# Patient Record
Sex: Female | Born: 1985 | Race: White | Hispanic: No | Marital: Married | Smoking: Never smoker
Health system: Southern US, Community
[De-identification: ages and names within clinical notes are randomized; demographics above are authoritative.]

## PROBLEM LIST (undated history)

## (undated) ENCOUNTER — Inpatient Hospital Stay (HOSPITAL_COMMUNITY): Payer: Self-pay

## (undated) DIAGNOSIS — K219 Gastro-esophageal reflux disease without esophagitis: Secondary | ICD-10-CM

## (undated) DIAGNOSIS — F32A Depression, unspecified: Secondary | ICD-10-CM

## (undated) DIAGNOSIS — R87629 Unspecified abnormal cytological findings in specimens from vagina: Secondary | ICD-10-CM

## (undated) DIAGNOSIS — G5600 Carpal tunnel syndrome, unspecified upper limb: Secondary | ICD-10-CM

## (undated) DIAGNOSIS — F419 Anxiety disorder, unspecified: Secondary | ICD-10-CM

## (undated) DIAGNOSIS — Z8619 Personal history of other infectious and parasitic diseases: Secondary | ICD-10-CM

## (undated) DIAGNOSIS — T4145XA Adverse effect of unspecified anesthetic, initial encounter: Secondary | ICD-10-CM

## (undated) DIAGNOSIS — T8859XA Other complications of anesthesia, initial encounter: Secondary | ICD-10-CM

## (undated) DIAGNOSIS — F329 Major depressive disorder, single episode, unspecified: Secondary | ICD-10-CM

## (undated) HISTORY — PX: ADENOIDECTOMY AND MYRINGOTOMY WITH TUBE PLACEMENT: SHX5714

## (undated) HISTORY — DX: Major depressive disorder, single episode, unspecified: F32.9

## (undated) HISTORY — DX: Depression, unspecified: F32.A

## (undated) HISTORY — DX: Carpal tunnel syndrome, unspecified upper limb: G56.00

## (undated) HISTORY — PX: TONSILLECTOMY: SUR1361

## (undated) HISTORY — PX: WISDOM TOOTH EXTRACTION: SHX21

## (undated) HISTORY — DX: Personal history of other infectious and parasitic diseases: Z86.19

---

## 2010-12-17 ENCOUNTER — Emergency Department (HOSPITAL_COMMUNITY)
Admission: EM | Admit: 2010-12-17 | Discharge: 2010-12-18 | Disposition: A | Discharge status: Home / Self Care | Payer: BC Managed Care – PPO | Attending: Emergency Medicine | Admitting: Emergency Medicine

## 2010-12-17 DIAGNOSIS — N940 Mittelschmerz: Secondary | ICD-10-CM | POA: Insufficient documentation

## 2010-12-17 DIAGNOSIS — R1084 Generalized abdominal pain: Secondary | ICD-10-CM | POA: Insufficient documentation

## 2010-12-17 LAB — URINALYSIS, ROUTINE W REFLEX MICROSCOPIC
Glucose, UA: NEGATIVE mg/dL
Leukocytes, UA: NEGATIVE
Protein, ur: NEGATIVE mg/dL
Specific Gravity, Urine: 1.037 — ABNORMAL HIGH (ref 1.005–1.030)
pH: 5.5 (ref 5.0–8.0)

## 2010-12-18 ENCOUNTER — Emergency Department (HOSPITAL_COMMUNITY): Payer: BC Managed Care – PPO

## 2010-12-18 LAB — DIFFERENTIAL
Eosinophils Absolute: 0 10*3/uL (ref 0.0–0.7)
Lymphocytes Relative: 41 % (ref 12–46)
Lymphs Abs: 3.1 10*3/uL (ref 0.7–4.0)
Neutro Abs: 3.7 10*3/uL (ref 1.7–7.7)
Neutrophils Relative %: 48 % (ref 43–77)

## 2010-12-18 LAB — CBC
Hemoglobin: 11.4 g/dL — ABNORMAL LOW (ref 12.0–15.0)
MCV: 85 fL (ref 78.0–100.0)
Platelets: 204 10*3/uL (ref 150–400)
RBC: 3.86 MIL/uL — ABNORMAL LOW (ref 3.87–5.11)
WBC: 7.7 10*3/uL (ref 4.0–10.5)

## 2010-12-18 LAB — COMPREHENSIVE METABOLIC PANEL
ALT: 16 U/L (ref 0–35)
AST: 18 U/L (ref 0–37)
CO2: 27 mEq/L (ref 19–32)
Chloride: 103 mEq/L (ref 96–112)
Creatinine, Ser: 0.73 mg/dL (ref 0.50–1.10)
GFR calc non Af Amer: >60 mL/min (ref >60)
Sodium: 138 mEq/L (ref 135–145)
Total Bilirubin: 0.4 mg/dL (ref 0.3–1.2)

## 2010-12-18 IMAGING — US US ABDOMEN COMPLETE
1 series · 13 of 25 positions shown · non-contrast
Comparison: None

CLINICAL DATA: Abdominal pain.

ABDOMINAL ULTRASOUND COMPLETE

[Series 1: us abdomen complete · 0.30mm/px · 13 of 47 slices shown]
[im 1/47]
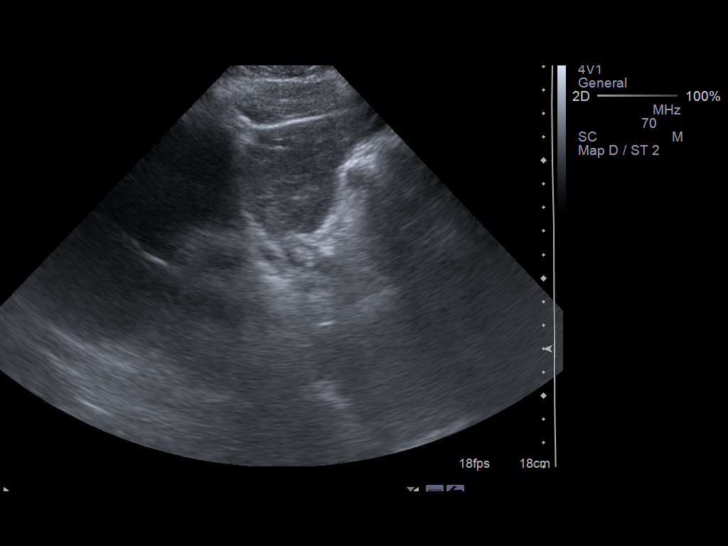
[im 4/47]
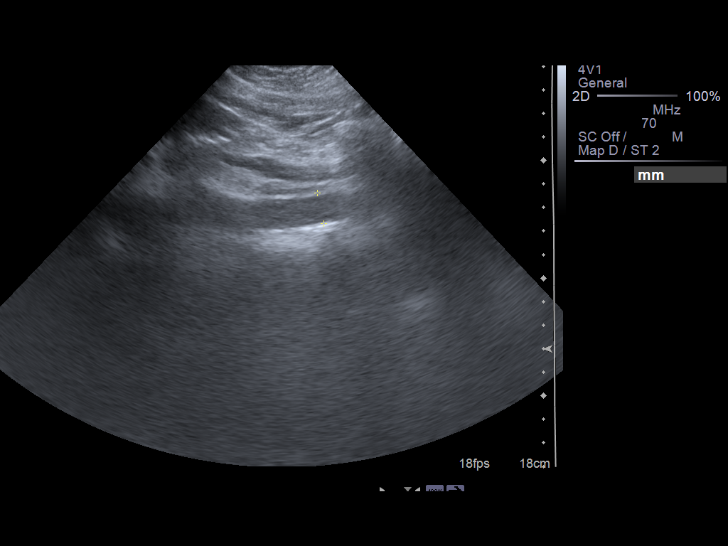
[im 8/47]
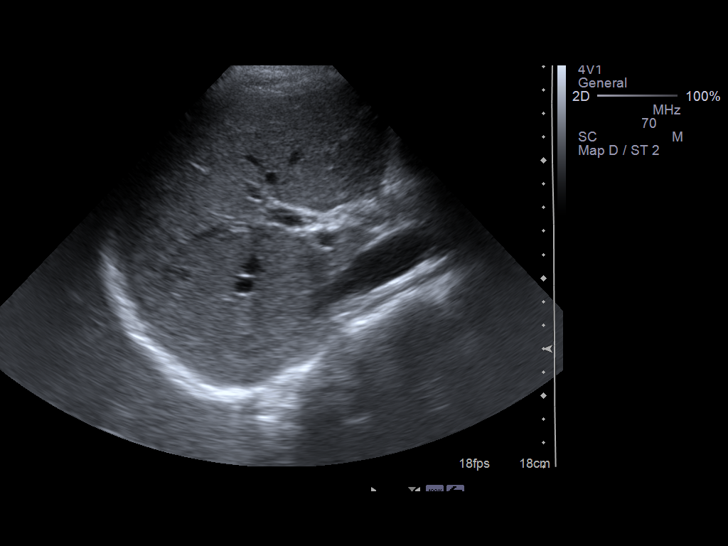
[im 12/47]
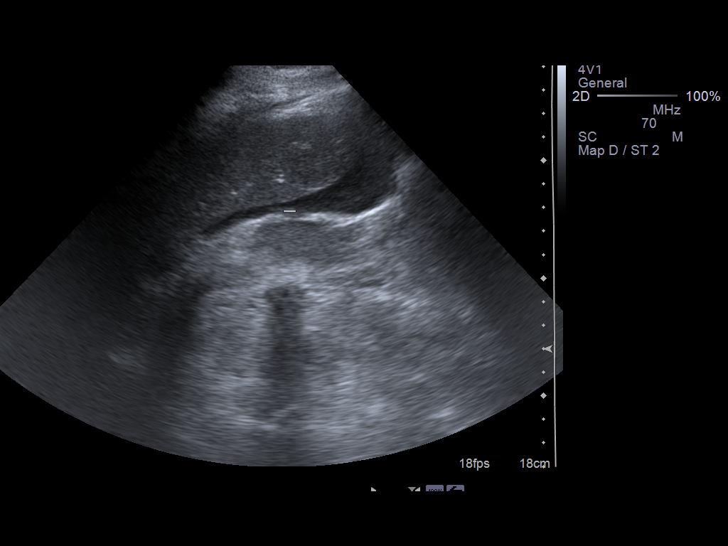
[im 16/47]
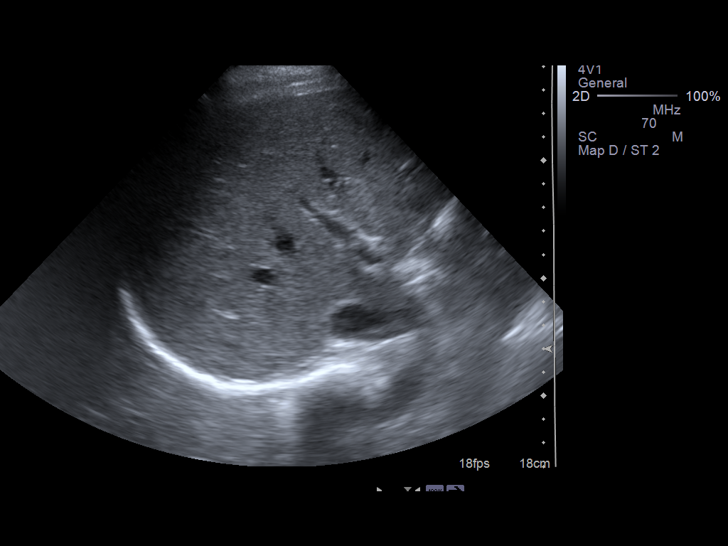
[im 20/47]
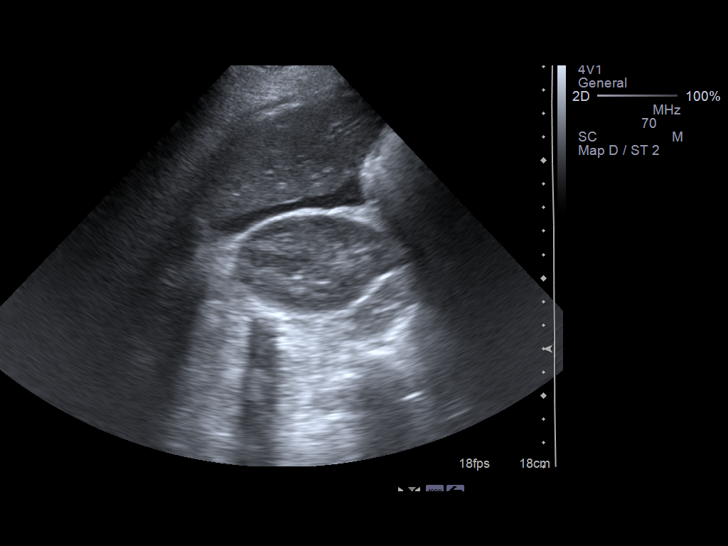
[im 24/47]
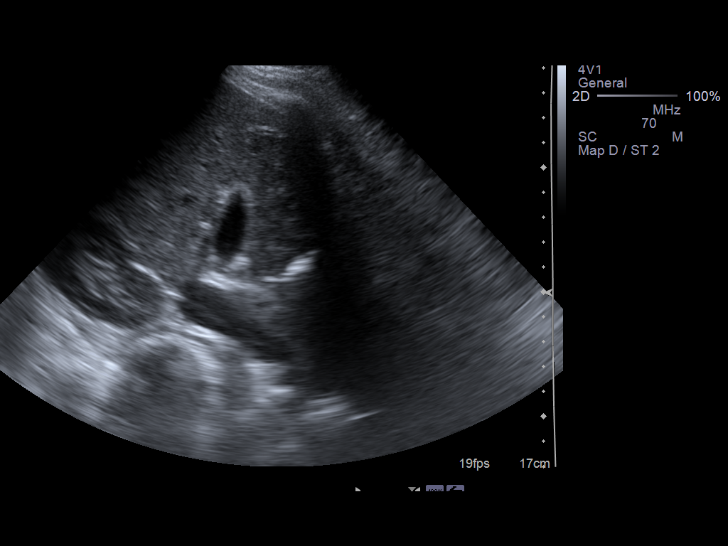
[im 27/47]
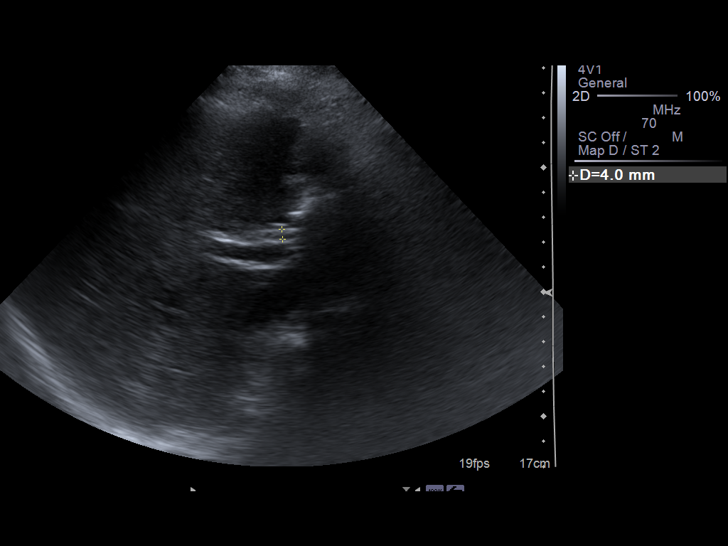
[im 31/47]
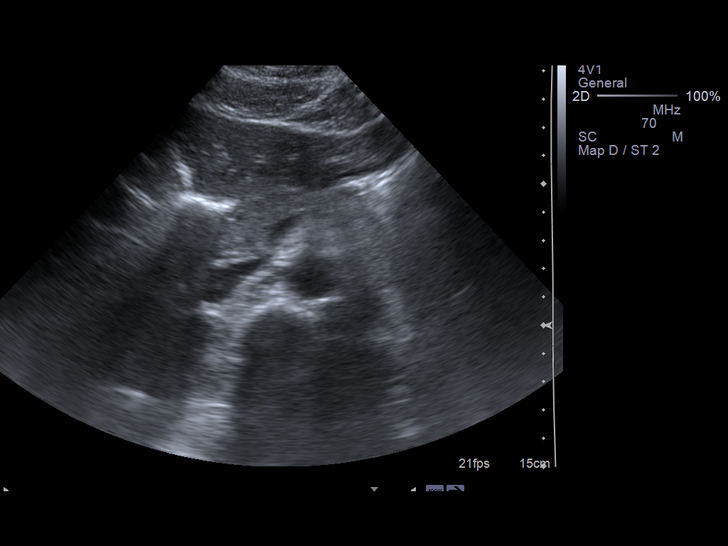
[im 35/47]
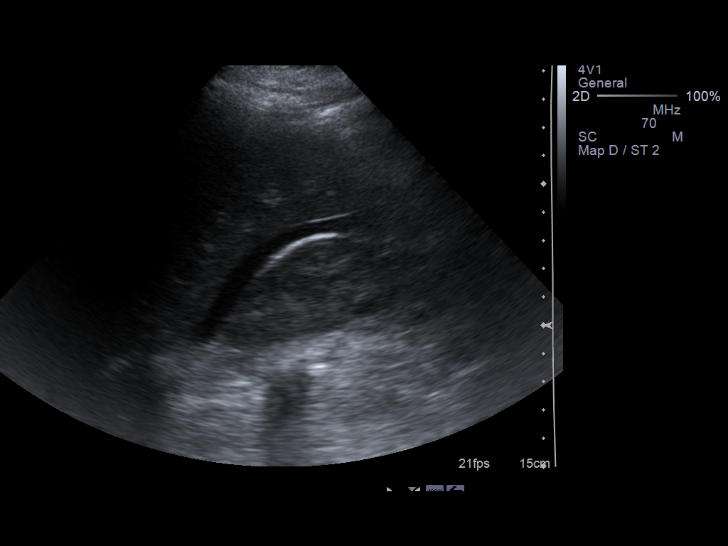
[im 39/47]
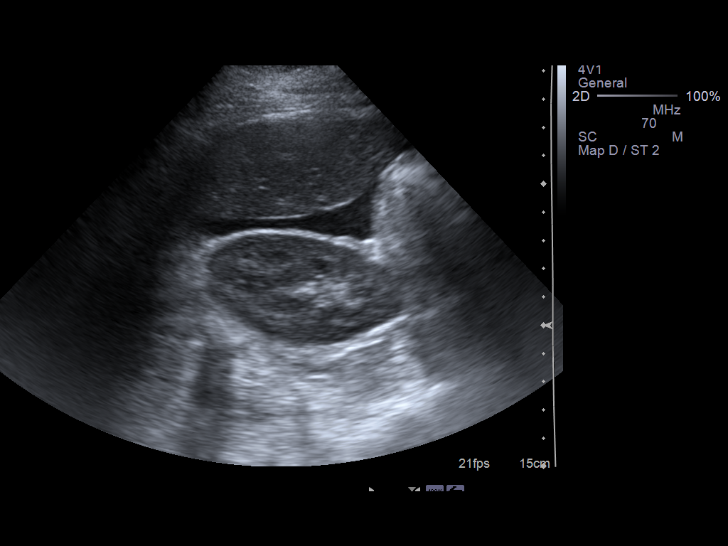
[im 43/47]
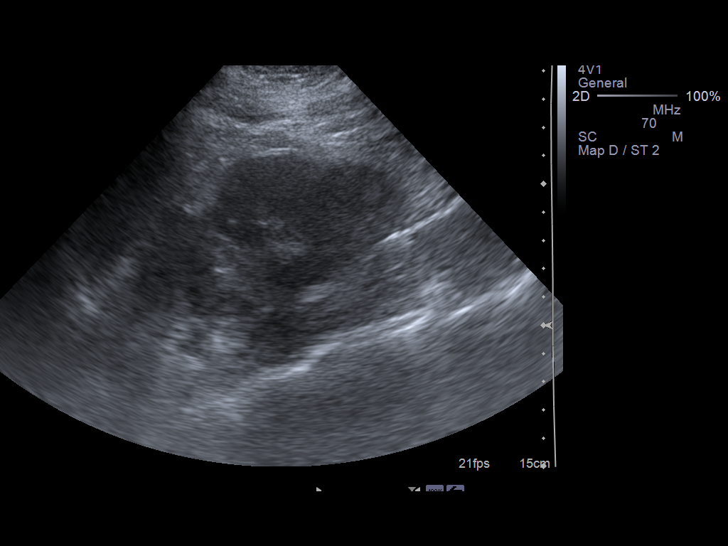
[im 47/47]
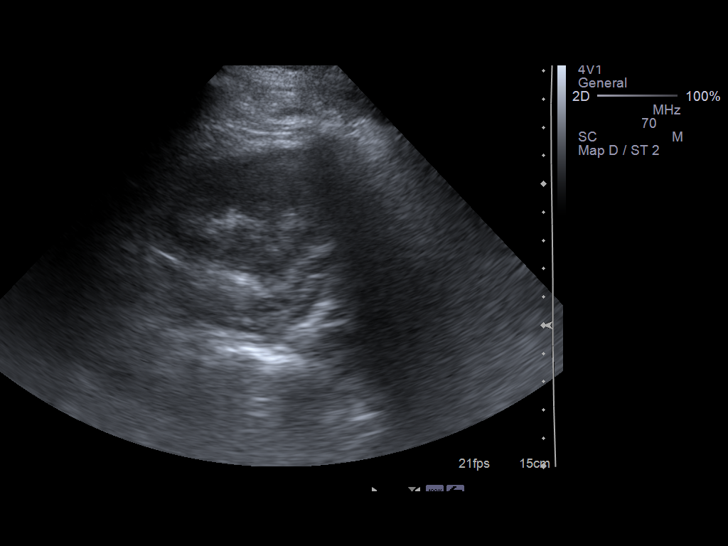

[13 of 25 positions shown; findings below may reference images not displayed]

FINDINGS: Gallbladder:  The gallbladder is normal in appearance, without
evidence for gallstones, gallbladder wall thickening or
pericholecystic fluid.  No ultrasonographic Murphy's sign is
elicited.

Common Bile Duct:  0.4 cm in diameter; within normal limits in
caliber.

Liver:  Normal parenchymal echogenicity and echotexture; no focal
lesions identified.  Limited Doppler evaluation demonstrates normal
blood flow within the liver.

IVC:  Unremarkable in appearance.

Pancreas:  Although the pancreas is difficult to visualize in its
entirety due to overlying bowel gas, no focal pancreatic
abnormality is identified.

Spleen:  8.2 cm in length; within normal limits in size and
echotexture.

Right kidney:  10.7 cm in length; normal in size, configuration and
parenchymal echogenicity.  No evidence of mass or hydronephrosis.

Left kidney:  11.0 cm in length; normal in size, configuration and
parenchymal echogenicity.  No evidence of mass or hydronephrosis.

Abdominal Aorta:  Normal in caliber; no aneurysm identified.

A small amount of ascites is noted in Morison's pouch.
IMPRESSION: 1.  Small amount of ascites noted in Morison's pouch.
2.  Otherwise unremarkable abdominal ultrasound.

## 2013-01-21 LAB — OB RESULTS CONSOLE ABO/RH: RH Type: NEGATIVE

## 2013-01-21 LAB — OB RESULTS CONSOLE GC/CHLAMYDIA
CHLAMYDIA, DNA PROBE: NEGATIVE
GC PROBE AMP, GENITAL: NEGATIVE

## 2013-01-21 LAB — OB RESULTS CONSOLE RPR: RPR: NONREACTIVE

## 2013-01-21 LAB — OB RESULTS CONSOLE HEPATITIS B SURFACE ANTIGEN: HEP B S AG: NEGATIVE

## 2013-01-21 LAB — OB RESULTS CONSOLE RUBELLA ANTIBODY, IGM: Rubella: IMMUNE

## 2013-01-21 LAB — OB RESULTS CONSOLE ANTIBODY SCREEN: ANTIBODY SCREEN: NEGATIVE

## 2013-01-21 LAB — OB RESULTS CONSOLE HIV ANTIBODY (ROUTINE TESTING): HIV: NONREACTIVE

## 2013-04-08 NOTE — L&D Delivery Note (Signed)
Delivery Note  SVD viable female Apgars 8,9 over 2nd degree ML episiotomy.  Nuchal x 1 reduced.  Placenta delivered spontaneously intact with 3VC. Repair with 2-0 Chromic with good support and hemostasis noted and R/V exam confirms.  PH art was 7.19.  [name] cord blood was not done.  Mother and baby were doing well.  EBL 350cc  Jaclyn Camp, MD

## 2013-07-22 LAB — OB RESULTS CONSOLE GBS: STREP GROUP B AG: POSITIVE

## 2013-08-14 ENCOUNTER — Inpatient Hospital Stay (HOSPITAL_COMMUNITY)
Admission: AD | Admit: 2013-08-14 | Discharge: 2013-08-14 | Disposition: A | Discharge status: Home / Self Care | Payer: BC Managed Care – PPO | Source: Ambulatory Visit | Attending: Obstetrics and Gynecology | Admitting: Obstetrics and Gynecology

## 2013-08-14 ENCOUNTER — Encounter (HOSPITAL_COMMUNITY): Payer: Self-pay | Admitting: *Deleted

## 2013-08-14 DIAGNOSIS — K219 Gastro-esophageal reflux disease without esophagitis: Secondary | ICD-10-CM | POA: Insufficient documentation

## 2013-08-14 DIAGNOSIS — O368190 Decreased fetal movements, unspecified trimester, not applicable or unspecified: Secondary | ICD-10-CM | POA: Insufficient documentation

## 2013-08-14 DIAGNOSIS — O3660X0 Maternal care for excessive fetal growth, unspecified trimester, not applicable or unspecified: Secondary | ICD-10-CM | POA: Insufficient documentation

## 2013-08-14 HISTORY — DX: Anxiety disorder, unspecified: F41.9

## 2013-08-14 HISTORY — DX: Unspecified abnormal cytological findings in specimens from vagina: R87.629

## 2013-08-14 HISTORY — DX: Gastro-esophageal reflux disease without esophagitis: K21.9

## 2013-08-14 NOTE — Discharge Instructions (Signed)
Third Trimester of Pregnancy °The third trimester is from week 29 through week 42, months 7 through 9. The third trimester is a time when the fetus is growing rapidly. At the end of the ninth month, the fetus is about 20 inches in length and weighs 6 10 pounds.  °BODY CHANGES °Your body goes through many changes during pregnancy. The changes vary from woman to woman.  °· Your weight will continue to increase. You can expect to gain 25 35 pounds (11 16 kg) by the end of the pregnancy. °· You may begin to get stretch marks on your hips, abdomen, and breasts. °· You may urinate more often because the fetus is moving lower into your pelvis and pressing on your bladder. °· You may develop or continue to have heartburn as a result of your pregnancy. °· You may develop constipation because certain hormones are causing the muscles that push waste through your intestines to slow down. °· You may develop hemorrhoids or swollen, bulging veins (varicose veins). °· You may have pelvic pain because of the weight gain and pregnancy hormones relaxing your joints between the bones in your pelvis. Back aches may result from over exertion of the muscles supporting your posture. °· Your breasts will continue to grow and be tender. A yellow discharge may leak from your breasts called colostrum. °· Your belly button may stick out. °· You may feel short of breath because of your expanding uterus. °· You may notice the fetus "dropping," or moving lower in your abdomen. °· You may have a bloody mucus discharge. This usually occurs a few days to a week before labor begins. °· Your cervix becomes thin and soft (effaced) near your due date. °WHAT TO EXPECT AT YOUR PRENATAL EXAMS  °You will have prenatal exams every 2 weeks until week 36. Then, you will have weekly prenatal exams. During a routine prenatal visit: °· You will be weighed to make sure you and the fetus are growing normally. °· Your blood pressure is taken. °· Your abdomen will be  measured to track your baby's growth. °· The fetal heartbeat will be listened to. °· Any test results from the previous visit will be discussed. °· You may have a cervical check near your due date to see if you have effaced. °At around 36 weeks, your caregiver will check your cervix. At the same time, your caregiver will also perform a test on the secretions of the vaginal tissue. This test is to determine if a type of bacteria, Group B streptococcus, is present. Your caregiver will explain this further. °Your caregiver may ask you: °· What your birth plan is. °· How you are feeling. °· If you are feeling the baby move. °· If you have had any abnormal symptoms, such as leaking fluid, bleeding, severe headaches, or abdominal cramping. °· If you have any questions. °Other tests or screenings that may be performed during your third trimester include: °· Blood tests that check for low iron levels (anemia). °· Fetal testing to check the health, activity level, and growth of the fetus. Testing is done if you have certain medical conditions or if there are problems during the pregnancy. °FALSE LABOR °You may feel small, irregular contractions that eventually go away. These are called Braxton Hicks contractions, or false labor. Contractions may last for hours, days, or even weeks before true labor sets in. If contractions come at regular intervals, intensify, or become painful, it is best to be seen by your caregiver.  °  SIGNS OF LABOR  °· Menstrual-like cramps. °· Contractions that are 5 minutes apart or less. °· Contractions that start on the top of the uterus and spread down to the lower abdomen and back. °· A sense of increased pelvic pressure or back pain. °· A watery or bloody mucus discharge that comes from the vagina. °If you have any of these signs before the 37th week of pregnancy, call your caregiver right away. You need to go to the hospital to get checked immediately. °HOME CARE INSTRUCTIONS  °· Avoid all  smoking, herbs, alcohol, and unprescribed drugs. These chemicals affect the formation and growth of the baby. °· Follow your caregiver's instructions regarding medicine use. There are medicines that are either safe or unsafe to take during pregnancy. °· Exercise only as directed by your caregiver. Experiencing uterine cramps is a good sign to stop exercising. °· Continue to eat regular, healthy meals. °· Wear a good support bra for breast tenderness. °· Do not use hot tubs, steam rooms, or saunas. °· Wear your seat belt at all times when driving. °· Avoid raw meat, uncooked cheese, cat litter boxes, and soil used by cats. These carry germs that can cause birth defects in the baby. °· Take your prenatal vitamins. °· Try taking a stool softener (if your caregiver approves) if you develop constipation. Eat more high-fiber foods, such as fresh vegetables or fruit and whole grains. Drink plenty of fluids to keep your urine clear or pale yellow. °· Take warm sitz baths to soothe any pain or discomfort caused by hemorrhoids. Use hemorrhoid cream if your caregiver approves. °· If you develop varicose veins, wear support hose. Elevate your feet for 15 minutes, 3 4 times a day. Limit salt in your diet. °· Avoid heavy lifting, wear low heal shoes, and practice good posture. °· Rest a lot with your legs elevated if you have leg cramps or low back pain. °· Visit your dentist if you have not gone during your pregnancy. Use a soft toothbrush to brush your teeth and be gentle when you floss. °· A sexual relationship may be continued unless your caregiver directs you otherwise. °· Do not travel far distances unless it is absolutely necessary and only with the approval of your caregiver. °· Take prenatal classes to understand, practice, and ask questions about the labor and delivery. °· Make a trial run to the hospital. °· Pack your hospital bag. °· Prepare the baby's nursery. °· Continue to go to all your prenatal visits as directed  by your caregiver. °SEEK MEDICAL CARE IF: °· You are unsure if you are in labor or if your water has broken. °· You have dizziness. °· You have mild pelvic cramps, pelvic pressure, or nagging pain in your abdominal area. °· You have persistent nausea, vomiting, or diarrhea. °· You have a bad smelling vaginal discharge. °· You have pain with urination. °SEEK IMMEDIATE MEDICAL CARE IF:  °· You have a fever. °· You are leaking fluid from your vagina. °· You have spotting or bleeding from your vagina. °· You have severe abdominal cramping or pain. °· You have rapid weight loss or gain. °· You have shortness of breath with chest pain. °· You notice sudden or extreme swelling of your face, hands, ankles, feet, or legs. °· You have not felt your baby move in over an hour. °· You have severe headaches that do not go away with medicine. °· You have vision changes. °Document Released: 03/19/2001 Document Revised: 11/25/2012 Document Reviewed:   05/26/2012 Exit[employer] Patient Information 2014 [place] Fetal Movement Counts Patient Name: _____Tara Starnes_____________________________________________ Patient Due Date: ___05/20_________________ Performing a fetal movement count is highly recommended in high-risk pregnancies, but it is good for every pregnant woman to do. Your caregiver may ask you to start counting fetal movements at 28 weeks of the pregnancy. Fetal movements often increase:  After eating a full meal.  After physical activity.  After eating or drinking something sweet or cold.  At rest. Pay attention to when you feel the baby is most active. This will help you notice a pattern of your baby's sleep and wake cycles and what factors contribute to an increase in fetal movement. It is important to perform a fetal movement count at the same time each day when your baby is normally most active.  HOW TO COUNT FETAL MOVEMENTS 1. Find a quiet and comfortable area to sit or lie down on your left side.  Lying on your left side provides the best blood and oxygen circulation to your baby. 2. Write down the day and time on a sheet of paper or in a journal. 3. Start counting kicks, flutters, swishes, rolls, or jabs in a 2 hour period. You should feel at least 10 movements within 2 hours. 4. If you do not feel 10 movements in 2 hours, wait 2 3 hours and count again. Look for a change in the pattern or not enough counts in 2 hours. SEEK MEDICAL CARE IF:  You feel less than 10 counts in 2 hours, tried twice.  There is no movement in over an hour.  The pattern is changing or taking longer each day to reach 10 counts in 2 hours.  You feel the baby is not moving as he or she usually does. Date: ____________ Movements: ____________ Start time: ____________ Doreatha MartinFinish time: ____________  Date: ____________ Movements: ____________ Start time: ____________ Doreatha MartinFinish time: ____________ Date: ____________ Movements: ____________ Start time: ____________ Doreatha MartinFinish time: ____________ Date: ____________ Movements: ____________ Start time: ____________ Doreatha MartinFinish time: ____________ Date: ____________ Movements: ____________ Start time: ____________ Doreatha MartinFinish time: ____________ Date: ____________ Movements: ____________ Start time: ____________ Doreatha MartinFinish time: ____________ Date: ____________ Movements: ____________ Start time: ____________ Doreatha MartinFinish time: ____________ Date: ____________ Movements: ____________ Start time: ____________ Doreatha MartinFinish time: ____________  Date: ____________ Movements: ____________ Start time: ____________ Doreatha MartinFinish time: ____________ Date: ____________ Movements: ____________ Start time: ____________ Doreatha MartinFinish time: ____________ Date: ____________ Movements: ____________ Start time: ____________ Doreatha MartinFinish time: ____________ Date: ____________ Movements: ____________ Start time: ____________ Doreatha MartinFinish time: ____________ Date: ____________ Movements: ____________ Start time: ____________ Doreatha MartinFinish time: ____________ Date:  ____________ Movements: ____________ Start time: ____________ Doreatha MartinFinish time: ____________ Date: ____________ Movements: ____________ Start time: ____________ Doreatha MartinFinish time: ____________  Date: ____________ Movements: ____________ Start time: ____________ Doreatha MartinFinish time: ____________ Date: ____________ Movements: ____________ Start time: ____________ Doreatha MartinFinish time: ____________ Date: ____________ Movements: ____________ Start time: ____________ Doreatha MartinFinish time: ____________ Date: ____________ Movements: ____________ Start time: ____________ Doreatha MartinFinish time: ____________ Date: ____________ Movements: ____________ Start time: ____________ Doreatha MartinFinish time: ____________ Date: ____________ Movements: ____________ Start time: ____________ Doreatha MartinFinish time: ____________ Date: ____________ Movements: ____________ Start time: ____________ Doreatha MartinFinish time: ____________  Date: ____________ Movements: ____________ Start time: ____________ Doreatha MartinFinish time: ____________ Date: ____________ Movements: ____________ Start time: ____________ Doreatha MartinFinish time: ____________ Date: ____________ Movements: ____________ Start time: ____________ Doreatha MartinFinish time: ____________ Date: ____________ Movements: ____________ Start time: ____________ Doreatha MartinFinish time: ____________ Date: ____________ Movements: ____________ Start time: ____________ Doreatha MartinFinish time: ____________

## 2013-08-14 NOTE — MAU Note (Signed)
Baby is moving, just not as much.  Did kick counts, got 10 movement in 50 min; was told this was really good, but patient states, usually get that quicker.  Denies any pain, bleeding or leaking. No problems with preg.

## 2013-08-14 NOTE — MAU Provider Note (Signed)
  History     CSN: [id number]  Arrival date and time: 08/14/13 1214   First Provider Initiated Contact with Patient 08/14/13 1323      Chief Complaint  Patient presents with  . Decreased Fetal Movement   HPI Pt is 38w3 days pregnant and presents with decrease fetal movement.  Pt states kicks are not as strong, more rolling Movement.  Pt denis problems with this pregnancy.  Pt denies vaginal bleeding,  Spotting, leakage of fluid.  Baby is Measuring larger than dates- no gestational diabetes.  Pt is primary Dr. Henderson Cloudomblin. RN note: Baby is moving, just not as much. Did kick counts, got 10 movement in 50 min; was told this was really good, but patient states, usually get that quicker. Denies any pain, bleeding or leaking. No problems with preg     Past Medical History  Diagnosis Date  . GERD (gastroesophageal reflux disease)   . Anxiety   . Vaginal Pap smear, abnormal     bx was normal    Past Surgical History  Procedure Laterality Date  . Tonsillectomy    . Adenoidectomy and myringotomy with tube placement      Family History  Problem Relation Age of Onset  . Cancer Maternal Grandmother     lung, smoker  . Diabetes Paternal Grandfather   . Hearing loss Neg Hx     History  Substance Use Topics  . Smoking status: Never Smoker   . Smokeless tobacco: Never Used  . Alcohol Use: Yes     Comment: occ, none with preg    Allergies: Allergies not on file  No prescriptions prior to admission    ROS Physical Exam   Blood pressure 122/74, pulse 97, temperature 98.4 F (36.9 C), temperature source Oral, resp. rate 18.  Physical Exam  MAU Course  Procedures Reactive FHR  D/c home by Dr. Renaldo FiddlerAdkins via nurse  Assessment and Plan    Jean RosenthalSusan P Brantlee Hinde 08/14/2013, 1:23 PM

## 2013-08-23 ENCOUNTER — Encounter (HOSPITAL_COMMUNITY): Payer: Self-pay | Admitting: *Deleted

## 2013-08-23 ENCOUNTER — Telehealth (HOSPITAL_COMMUNITY): Payer: Self-pay | Admitting: *Deleted

## 2013-08-23 NOTE — Telephone Encounter (Signed)
Preadmission screen  

## 2013-08-26 ENCOUNTER — Inpatient Hospital Stay (HOSPITAL_COMMUNITY): Admission: RE | Admit: 2013-08-26 | Payer: BC Managed Care – PPO | Source: Ambulatory Visit

## 2013-09-01 ENCOUNTER — Inpatient Hospital Stay (HOSPITAL_COMMUNITY): Admission: RE | Admit: 2013-09-01 | Payer: BC Managed Care – PPO | Source: Ambulatory Visit

## 2013-09-02 ENCOUNTER — Inpatient Hospital Stay (HOSPITAL_COMMUNITY): Payer: BC Managed Care – PPO | Admitting: Anesthesiology

## 2013-09-02 ENCOUNTER — Inpatient Hospital Stay (HOSPITAL_COMMUNITY)
Admission: RE | Admit: 2013-09-02 | Discharge: 2013-09-04 | DRG: 775 | Disposition: A | Discharge status: Home / Self Care | Payer: BC Managed Care – PPO | Source: Ambulatory Visit | Attending: Obstetrics and Gynecology | Admitting: Obstetrics and Gynecology

## 2013-09-02 ENCOUNTER — Encounter (HOSPITAL_COMMUNITY): Payer: BC Managed Care – PPO | Admitting: Anesthesiology

## 2013-09-02 ENCOUNTER — Encounter (HOSPITAL_COMMUNITY): Payer: Self-pay

## 2013-09-02 DIAGNOSIS — K219 Gastro-esophageal reflux disease without esophagitis: Secondary | ICD-10-CM | POA: Diagnosis present

## 2013-09-02 DIAGNOSIS — O99214 Obesity complicating childbirth: Secondary | ICD-10-CM

## 2013-09-02 DIAGNOSIS — E669 Obesity, unspecified: Secondary | ICD-10-CM | POA: Diagnosis present

## 2013-09-02 DIAGNOSIS — Z6841 Body Mass Index (BMI) 40.0 and over, adult: Secondary | ICD-10-CM

## 2013-09-02 DIAGNOSIS — Z833 Family history of diabetes mellitus: Secondary | ICD-10-CM

## 2013-09-02 DIAGNOSIS — O480 Post-term pregnancy: Principal | ICD-10-CM | POA: Diagnosis present

## 2013-09-02 LAB — CBC
HEMATOCRIT: 37.6 % (ref 36.0–46.0)
Hemoglobin: 12.3 g/dL (ref 12.0–15.0)
MCH: 28.3 pg (ref 26.0–34.0)
MCHC: 32.7 g/dL (ref 30.0–36.0)
MCV: 86.6 fL (ref 78.0–100.0)
PLATELETS: 198 10*3/uL (ref 150–400)
RBC: 4.34 MIL/uL (ref 3.87–5.11)
RDW: 13.9 % (ref 11.5–15.5)
WBC: 14 10*3/uL — ABNORMAL HIGH (ref 4.0–10.5)

## 2013-09-02 LAB — RPR

## 2013-09-02 MED ORDER — LACTATED RINGERS IV SOLN
500.0000 mL | INTRAVENOUS | Status: DC | PRN
  Administered 2013-09-02: 1000 mL via INTRAVENOUS

## 2013-09-02 MED ORDER — OXYCODONE-ACETAMINOPHEN 5-325 MG PO TABS: 1.0000 | ORAL_TABLET | ORAL | Status: DC | PRN

## 2013-09-02 MED ORDER — IBUPROFEN 600 MG PO TABS
600.0000 mg | ORAL_TABLET | Freq: Four times a day (QID) | ORAL | Status: DC | PRN
  Administered 2013-09-03: 600 mg via ORAL
  Filled 2013-09-02: qty 1

## 2013-09-02 MED ORDER — FLEET ENEMA 7-19 GM/118ML RE ENEM: 1.0000 | ENEMA | RECTAL | Status: DC | PRN

## 2013-09-02 MED ORDER — PHENYLEPHRINE 40 MCG/ML (10ML) SYRINGE FOR IV PUSH (FOR BLOOD PRESSURE SUPPORT)
80.0000 ug | PREFILLED_SYRINGE | INTRAVENOUS | Status: DC | PRN
  Administered 2013-09-02 (×2): 80 ug via INTRAVENOUS
  Filled 2013-09-02: qty 2
  Filled 2013-09-02: qty 10

## 2013-09-02 MED ORDER — PENICILLIN G POTASSIUM 5000000 UNITS IJ SOLR
2.5000 10*6.[IU] | INTRAVENOUS | Status: DC
  Administered 2013-09-02 – 2013-09-03 (×4): 2.5 10*6.[IU] via INTRAVENOUS
  Filled 2013-09-02 (×8): qty 2.5

## 2013-09-02 MED ORDER — LIDOCAINE HCL (PF) 1 % IJ SOLN
30.0000 mL | INTRAMUSCULAR | Status: DC | PRN
  Filled 2013-09-02: qty 30

## 2013-09-02 MED ORDER — ACETAMINOPHEN 325 MG PO TABS: 650.0000 mg | ORAL_TABLET | ORAL | Status: DC | PRN

## 2013-09-02 MED ORDER — EPHEDRINE 5 MG/ML INJ
10.0000 mg | INTRAVENOUS | Status: AC | PRN
  Administered 2013-09-02: 10 mg via INTRAVENOUS
  Administered 2013-09-02: 30 mg via INTRAVENOUS
  Filled 2013-09-02: qty 4

## 2013-09-02 MED ORDER — LACTATED RINGERS IV SOLN
500.0000 mL | Freq: Once | INTRAVENOUS | Status: AC
  Administered 2013-09-02: 1000 mL via INTRAVENOUS

## 2013-09-02 MED ORDER — TERBUTALINE SULFATE 1 MG/ML IJ SOLN: 0.2500 mg | Freq: Once | INTRAMUSCULAR | Status: AC | PRN

## 2013-09-02 MED ORDER — PENICILLIN G POTASSIUM 5000000 UNITS IJ SOLR
5.0000 10*6.[IU] | Freq: Once | INTRAVENOUS | Status: AC
  Administered 2013-09-02: 5 10*6.[IU] via INTRAVENOUS
  Filled 2013-09-02: qty 5

## 2013-09-02 MED ORDER — EPHEDRINE 5 MG/ML INJ
10.0000 mg | INTRAVENOUS | Status: DC | PRN
  Filled 2013-09-02: qty 4
  Filled 2013-09-02: qty 2

## 2013-09-02 MED ORDER — FENTANYL 2.5 MCG/ML BUPIVACAINE 1/10 % EPIDURAL INFUSION (WH - ANES)
INTRAMUSCULAR | Status: DC | PRN
  Administered 2013-09-02: 14 mL/h via EPIDURAL

## 2013-09-02 MED ORDER — PHENYLEPHRINE 40 MCG/ML (10ML) SYRINGE FOR IV PUSH (FOR BLOOD PRESSURE SUPPORT)
PREFILLED_SYRINGE | INTRAVENOUS | Status: AC
  Administered 2013-09-02: 40 ug
  Filled 2013-09-02: qty 10

## 2013-09-02 MED ORDER — PHENYLEPHRINE 40 MCG/ML (10ML) SYRINGE FOR IV PUSH (FOR BLOOD PRESSURE SUPPORT)
80.0000 ug | PREFILLED_SYRINGE | INTRAVENOUS | Status: DC | PRN
  Filled 2013-09-02: qty 2
  Filled 2013-09-02 (×2): qty 10

## 2013-09-02 MED ORDER — DIPHENHYDRAMINE HCL 50 MG/ML IJ SOLN: 12.5000 mg | INTRAMUSCULAR | Status: DC | PRN

## 2013-09-02 MED ORDER — LIDOCAINE HCL (PF) 1 % IJ SOLN
INTRAMUSCULAR | Status: DC | PRN
  Administered 2013-09-02: 8 mL
  Administered 2013-09-02: 5 mL
  Administered 2013-09-02: 9 mL

## 2013-09-02 MED ORDER — FENTANYL 2.5 MCG/ML BUPIVACAINE 1/10 % EPIDURAL INFUSION (WH - ANES)
14.0000 mL/h | INTRAMUSCULAR | Status: DC | PRN
  Filled 2013-09-02: qty 125

## 2013-09-02 MED ORDER — EPHEDRINE 5 MG/ML INJ
INTRAVENOUS | Status: AC
  Filled 2013-09-02: qty 4

## 2013-09-02 MED ORDER — OXYTOCIN 40 UNITS IN LACTATED RINGERS INFUSION - SIMPLE MED
62.5000 mL/h | INTRAVENOUS | Status: DC
  Administered 2013-09-03: 62.5 mL/h via INTRAVENOUS

## 2013-09-02 MED ORDER — FENTANYL 2.5 MCG/ML BUPIVACAINE 1/10 % EPIDURAL INFUSION (WH - ANES)
INTRAMUSCULAR | Status: AC
  Filled 2013-09-02: qty 125

## 2013-09-02 MED ORDER — OXYTOCIN 40 UNITS IN LACTATED RINGERS INFUSION - SIMPLE MED
1.0000 m[IU]/min | INTRAVENOUS | Status: DC
  Administered 2013-09-02: 2 m[IU]/min via INTRAVENOUS
  Administered 2013-09-02: 4 m[IU]/min via INTRAVENOUS
  Filled 2013-09-02: qty 1000

## 2013-09-02 MED ORDER — OXYTOCIN BOLUS FROM INFUSION
500.0000 mL | INTRAVENOUS | Status: DC
  Administered 2013-09-03: 500 mL via INTRAVENOUS

## 2013-09-02 MED ORDER — CITRIC ACID-SODIUM CITRATE 334-500 MG/5ML PO SOLN
30.0000 mL | ORAL | Status: DC | PRN
Start: 2013-09-02 — End: 2013-09-03

## 2013-09-02 MED ORDER — ONDANSETRON HCL 4 MG/2ML IJ SOLN
4.0000 mg | Freq: Four times a day (QID) | INTRAMUSCULAR | Status: DC | PRN
  Filled 2013-09-02: qty 2

## 2013-09-02 MED ORDER — LACTATED RINGERS IV SOLN
INTRAVENOUS | Status: DC
  Administered 2013-09-02 (×2): 1000 mL via INTRAVENOUS
  Administered 2013-09-03: via INTRAVENOUS

## 2013-09-02 NOTE — Progress Notes (Signed)
contacted Anesthsia, 3 cc ephedrine IVP ordered

## 2013-09-02 NOTE — Anesthesia Preprocedure Evaluation (Signed)
Anesthesia Evaluation  Patient identified by MRN, date of birth, ID band Patient awake    Reviewed: Allergy & Precautions, H&P , NPO status , Patient's Chart, lab work & pertinent test results  Airway Mallampati: II TM Distance: >3 FB Neck ROM: full    Dental no notable dental hx.    Pulmonary neg pulmonary ROS,    Pulmonary exam normal       Cardiovascular negative cardio ROS      Neuro/Psych negative psych ROS   GI/Hepatic Neg liver ROS,   Endo/Other  Morbid obesity  Renal/GU negative Renal ROS     Musculoskeletal   Abdominal (+) + obese,   Peds  Hematology negative hematology ROS (+)   Anesthesia Other Findings   Reproductive/Obstetrics (+) Pregnancy                           Anesthesia Physical Anesthesia Plan  ASA: III  Anesthesia Plan: Epidural   Post-op Pain Management:    Induction:   Airway Management Planned:   Additional Equipment:   Intra-op Plan:   Post-operative Plan:   Informed Consent: I have reviewed the patients History and Physical, chart, labs and discussed the procedure including the risks, benefits and alternatives for the proposed anesthesia with the patient or authorized representative who has indicated his/her understanding and acceptance.     Plan Discussed with:   Anesthesia Plan Comments:         Anesthesia Quick Evaluation

## 2013-09-02 NOTE — Progress Notes (Signed)
Two more RN's in room , fluid bolus, alcohol prep pt and ammonia inhalant admin. Pt regain conc, Zofran given IV push turned to left side.

## 2013-09-02 NOTE — Progress Notes (Signed)
@   extra Rns in room Dr Arby Barrette in room, epidural off, Dr Rana Snare on way

## 2013-09-02 NOTE — Anesthesia Procedure Notes (Signed)
Epidural Patient location during procedure: OB Start time: 09/02/2013 4:07 PM End time: 09/02/2013 4:11 PM  Staffing Anesthesiologist: Leilani Able Performed by: anesthesiologist   Preanesthetic Checklist Completed: patient identified, surgical consent, pre-op evaluation, timeout performed, IV checked, risks and benefits discussed and monitors and equipment checked  Epidural Patient position: sitting Prep: site prepped and draped and DuraPrep Patient monitoring: continuous pulse ox and blood pressure Approach: midline Location: L3-L4 Injection technique: LOR air  Needle:  Needle type: Tuohy  Needle gauge: 17 G Needle length: 9 cm and 9 Needle insertion depth: 6 cm Catheter type: closed end flexible Catheter size: 19 Gauge Catheter at skin depth: 11 cm Test dose: negative and Other  Assessment Sensory level: T9 Events: blood not aspirated, injection not painful, no injection resistance, negative IV test and no paresthesia  Additional Notes Reason for block:procedure for pain

## 2013-09-02 NOTE — H&P (Signed)
Jaclyn Jones is a 28 y.o. female presenting for IOL due to postdates.  Pregnancy uncomplicated.  GBS -. History OB History   Grav Para Term Preterm Abortions TAB SAB Ect Mult Living   1              Past Medical History  Diagnosis Date  . GERD (gastroesophageal reflux disease)   . Anxiety   . Vaginal Pap smear, abnormal     bx was normal  . Carpal tunnel syndrome    Past Surgical History  Procedure Laterality Date  . Tonsillectomy    . Adenoidectomy and myringotomy with tube placement     Family History: family history includes Cancer in her maternal grandmother; Diabetes in her paternal grandfather. There is no history of Hearing loss. Social History:  reports that she has never smoked. She has never used smokeless tobacco. She reports that she drinks alcohol. She reports that she does not use illicit drugs.   Prenatal Transfer Tool  Maternal Diabetes: No Genetic Screening: Normal Maternal Ultrasounds/Referrals: Normal Fetal Ultrasounds or other Referrals:  None Maternal Substance Abuse:  No Significant Maternal Medications:  None Significant Maternal Lab Results:  None Other Comments:  None  ROS  Dilation: 3 Effacement (%): 70 Station: -2 Exam by:: Dr Rana Snare Blood pressure 133/92, pulse 119, temperature 98.8 F (37.1 C), temperature source Oral, resp. rate 18, height 5\' 4"  (1.626 m), weight 108.863 kg (240 lb). Exam Physical Exam  Prenatal labs: ABO, Rh: O/Negative/-- (10/16 0000) Antibody: Negative (10/16 0000) Rubella: Immune (10/16 0000) RPR: Nonreactive (10/16 0000)  HBsAg: Negative (10/16 0000)  HIV: Non-reactive (10/16 0000)  GBS: Positive (04/16 0000)   Assessment/Plan: IUP postdates for IOL AROM with possible Pitocin Abx for GBS Anticipate SVD   Turner Daniels 09/02/2013, 9:36 AM

## 2013-09-03 ENCOUNTER — Encounter (HOSPITAL_COMMUNITY): Payer: Self-pay

## 2013-09-03 LAB — CBC
HEMATOCRIT: 32.6 % — AB (ref 36.0–46.0)
Hemoglobin: 10.8 g/dL — ABNORMAL LOW (ref 12.0–15.0)
MCH: 28.8 pg (ref 26.0–34.0)
MCHC: 33.1 g/dL (ref 30.0–36.0)
MCV: 86.9 fL (ref 78.0–100.0)
Platelets: 215 10*3/uL (ref 150–400)
RBC: 3.75 MIL/uL — ABNORMAL LOW (ref 3.87–5.11)
RDW: 14 % (ref 11.5–15.5)
WBC: 32.7 10*3/uL — ABNORMAL HIGH (ref 4.0–10.5)

## 2013-09-03 MED ORDER — WITCH HAZEL-GLYCERIN EX PADS: 1.0000 "application " | MEDICATED_PAD | CUTANEOUS | Status: DC | PRN

## 2013-09-03 MED ORDER — IBUPROFEN 600 MG PO TABS
600.0000 mg | ORAL_TABLET | Freq: Four times a day (QID) | ORAL | Status: DC
  Administered 2013-09-03 – 2013-09-04 (×7): 600 mg via ORAL
  Filled 2013-09-03 (×7): qty 1

## 2013-09-03 MED ORDER — ONDANSETRON HCL 4 MG/2ML IJ SOLN: 4.0000 mg | INTRAMUSCULAR | Status: DC | PRN

## 2013-09-03 MED ORDER — MEASLES, MUMPS & RUBELLA VAC ~~LOC~~ INJ
0.5000 mL | INJECTION | Freq: Once | SUBCUTANEOUS | Status: DC
  Filled 2013-09-03: qty 0.5

## 2013-09-03 MED ORDER — PRENATAL MULTIVITAMIN CH
1.0000 | ORAL_TABLET | Freq: Every day | ORAL | Status: DC
  Administered 2013-09-03 – 2013-09-04 (×2): 1 via ORAL
  Filled 2013-09-03 (×2): qty 1

## 2013-09-03 MED ORDER — BENZOCAINE-MENTHOL 20-0.5 % EX AERO
1.0000 "application " | INHALATION_SPRAY | CUTANEOUS | Status: DC | PRN
  Administered 2013-09-03 – 2013-09-04 (×2): 1 via TOPICAL
  Filled 2013-09-03 (×2): qty 56

## 2013-09-03 MED ORDER — LANOLIN HYDROUS EX OINT: TOPICAL_OINTMENT | CUTANEOUS | Status: DC | PRN

## 2013-09-03 MED ORDER — ZOLPIDEM TARTRATE 5 MG PO TABS: 5.0000 mg | ORAL_TABLET | Freq: Every evening | ORAL | Status: DC | PRN

## 2013-09-03 MED ORDER — ONDANSETRON HCL 4 MG PO TABS: 4.0000 mg | ORAL_TABLET | ORAL | Status: DC | PRN

## 2013-09-03 MED ORDER — TETANUS-DIPHTH-ACELL PERTUSSIS 5-2.5-18.5 LF-MCG/0.5 IM SUSP: 0.5000 mL | Freq: Once | INTRAMUSCULAR | Status: DC

## 2013-09-03 MED ORDER — MEDROXYPROGESTERONE ACETATE 150 MG/ML IM SUSP: 150.0000 mg | INTRAMUSCULAR | Status: DC | PRN

## 2013-09-03 MED ORDER — DIPHENHYDRAMINE HCL 25 MG PO CAPS: 25.0000 mg | ORAL_CAPSULE | Freq: Four times a day (QID) | ORAL | Status: DC | PRN

## 2013-09-03 MED ORDER — OXYCODONE-ACETAMINOPHEN 5-325 MG PO TABS: 1.0000 | ORAL_TABLET | ORAL | Status: DC | PRN

## 2013-09-03 MED ORDER — SENNOSIDES-DOCUSATE SODIUM 8.6-50 MG PO TABS
2.0000 | ORAL_TABLET | ORAL | Status: DC
  Administered 2013-09-03: 2 via ORAL
  Filled 2013-09-03: qty 2

## 2013-09-03 MED ORDER — DIBUCAINE 1 % RE OINT: 1.0000 "application " | TOPICAL_OINTMENT | RECTAL | Status: DC | PRN

## 2013-09-03 MED ORDER — SIMETHICONE 80 MG PO CHEW: 80.0000 mg | CHEWABLE_TABLET | ORAL | Status: DC | PRN

## 2013-09-03 NOTE — Progress Notes (Signed)
Pulled a fentanyl bag from pyxis for epidural but patient delivered quickly.  Fentanyl bag was returned to pharmacy who replaced it in the pyxis.

## 2013-09-03 NOTE — Progress Notes (Signed)
Post Partum Day 0 Subjective: no complaints, up ad lib, voiding and tolerating PO  Objective: Blood pressure 105/64, pulse 108, temperature 99 F (37.2 C), temperature source Oral, resp. rate 18, height 5\' 4"  (1.626 m), weight 240 lb (108.863 kg), SpO2 99.00%, unknown if currently breastfeeding.  Physical Exam:  General: alert and cooperative Lochia: appropriate Uterine Fundus: firm Incision: perineum intact DVT Evaluation: No evidence of DVT seen on physical exam. Negative Homan's sign. No cords or calf tenderness. No significant calf/ankle edema.   Recent Labs  09/02/13 0812 09/03/13 0650  HGB 12.3 10.8*  HCT 37.6 32.6*    Assessment/Plan: Plan for discharge tomorrow Repeat CBC, leukocytosis noted. Temp rechecked at rounding 98.41F  LOS: 1 day   Judith Blonder 09/03/2013, 8:06 AM

## 2013-09-03 NOTE — Anesthesia Postprocedure Evaluation (Signed)
Anesthesia Post Note  Patient: Jaclyn Jones  Procedure(s) Performed: * No procedures listed *  Anesthesia type: Epidural  Patient location: Mother/Baby  Post pain: Pain level controlled  Post assessment: Post-op Vital signs reviewed  Last Vitals:  Filed Vitals:   09/03/13 0419  BP: 105/64  Pulse:   Temp: 37.2 C  Resp: 18    Post vital signs: Reviewed  Level of consciousness:alert  Complications: No apparent anesthesia complications

## 2013-09-03 NOTE — Lactation Note (Signed)
This note was copied from the chart of Jaclyn Jaqueline Leys. Lactation Consultation Note: Initial visit with this mom. She reports that baby has been latching well with no pain, Attended our BF classes. Reviewed watching for feeding cues and feeding whenever she sees them. BF brochure given with resources for support after DC. Baby asleep in grandmother's arms. No questions at present. To call for assist prn.   Patient Name: Jaclyn Jones [id number] Date: 09/03/2013 Reason for consult: Initial assessment   Maternal Data Formula Feeding for Exclusion: No Infant to breast within first hour of birth: Yes Has patient been taught Hand Expression?: Yes Does the patient have breastfeeding experience prior to this delivery?: No  Feeding    LATCH Score/Interventions                      Lactation Tools Discussed/Used     Consult Status Consult Status: Follow-up Date: 09/04/13 Follow-up type: In-patient    Pamelia Hoit 09/03/2013, 1:16 PM

## 2013-09-03 NOTE — Progress Notes (Signed)
Patient was referred for history of depression/anxiety. * Referral screened out by Clinical Social Worker because none of the following criteria appear to apply:  ~ History of anxiety/depression during this pregnancy, or of post-partum depression.  ~ Diagnosis of anxiety and/or depression within last 3 years  ~ History of depression due to pregnancy loss/loss of child  OR * Patient's symptoms currently being treated with medication and/or therapy.  Please contact the Clinical Social Worker if needs arise, or by the patient's request. Anxious symptoms were "work related" (situational).  Pt is no longer working for that employer & states she is doing well.  FOB at the bedside & appears supportive.  

## 2013-09-03 NOTE — Progress Notes (Signed)
I witnessed Venetia Constable, RN returning fentanyl bag to pharmacy.

## 2013-09-04 ENCOUNTER — Ambulatory Visit: Payer: Self-pay

## 2013-09-04 LAB — CBC
HCT: 30.1 % — ABNORMAL LOW (ref 36.0–46.0)
Hemoglobin: 9.7 g/dL — ABNORMAL LOW (ref 12.0–15.0)
MCH: 28.6 pg (ref 26.0–34.0)
MCHC: 32.2 g/dL (ref 30.0–36.0)
MCV: 88.8 fL (ref 78.0–100.0)
PLATELETS: 189 10*3/uL (ref 150–400)
RBC: 3.39 MIL/uL — ABNORMAL LOW (ref 3.87–5.11)
RDW: 14.2 % (ref 11.5–15.5)
WBC: 20.6 10*3/uL — ABNORMAL HIGH (ref 4.0–10.5)

## 2013-09-04 NOTE — Discharge Summary (Signed)
Obstetric Discharge Summary Reason for Admission: induction of labor Prenatal Procedures: none Intrapartum Procedures: spontaneous vaginal delivery Postpartum Procedures: none Complications-Operative and Postpartum: episiotomy Hemoglobin  Date Value Ref Range Status  09/04/2013 9.7* 12.0 - 15.0 g/dL Final     HCT  Date Value Ref Range Status  09/04/2013 30.1* 36.0 - 46.0 % Final    Physical Exam:  General: alert Lochia: appropriate Uterine Fundus: firm Incision: na DVT Evaluation: No evidence of DVT seen on physical exam.  Discharge Diagnoses: Term Pregnancy-delivered  Discharge Information: Date: 09/04/2013 Activity: pelvic rest Diet: routine Medications: PNV and Ibuprofen Condition: stable Instructions: refer to practice specific booklet Discharge to: home   Newborn Data: Live born female  Birth Weight: 9 lb 0.6 oz (4099 g) APGAR: 8, 9  Home with mother.  Jaclyn Jones Jaclyn Jones 09/04/2013, 9:32 AM

## 2013-09-04 NOTE — Lactation Note (Signed)
This note was copied from the chart of Jaclyn Trinae Alvares. Lactation Consultation Note  Patient Name: Jaclyn Jones [id number]'[id number] Date: 09/04/2013   RN requested comfort gels. RN reports Mom is getting tender with baby cluster feeding. RN also reports Mom has decided to supplement since baby has not passed stool since delivery and last void was 0230 this am. RN reports that Mom is concerned baby is not getting enough breast milk with breastfeeding. Advised RN to review with Mom guidelines for supplementing with breastfeeding. RN reports Mom plans to supplement using curved tipped syringe while baby is at the breast. RN will demonstrate how to use curved tipped syringe. RN reports not setting up DEBP as she felt Mom was not up to using the breast pump at this time reporting Mom was overwhelmed and requesting some time to sleep.   Maternal Data    Feeding Feeding Type: Breast Fed Length of feed: 15 min  LATCH Score/Interventions                      Lactation Tools Discussed/Used     Consult Status      Alfred Levins 09/04/2013, 11:33 PM

## 2013-09-04 NOTE — Lactation Note (Signed)
This note was copied from the chart of Jaclyn Jones. Lactation Consultation Note  Patient Name: Jaclyn Jones Date: 09/04/2013 Reason for consult: Follow-up assessment Mom had baby latched to left breast in cross cradle when I arrived. Baby demonstrated a good suckling pattern with stimulation, 1-2 swallows. Reviewed breast compression and how to bring bottom lip down for more depth. Mom latched baby without assist to right breast in cross cradle few swallows noted. Advised parents baby should be at the breast 8-12 times in 24 hours for 15-20 minutes, both breasts if possible. Advised to continue to monitor voids/stools, refer to page 24 of Baby N Me booklet. Engorgement care reviewed if needed, advised of OP services and support group.   Maternal Data    Feeding Feeding Type: Breast Fed Length of feed: 17 min  LATCH Score/Interventions Latch: Grasps breast easily, tongue down, lips flanged, rhythmical sucking. Intervention(s): Breast massage;Breast compression  Audible Swallowing: A few with stimulation  Type of Nipple: Everted at rest and after stimulation  Comfort (Breast/Nipple): Soft / non-tender     Hold (Positioning): No assistance needed to correctly position infant at breast. Intervention(s): Breastfeeding basics reviewed;Support Pillows;Position options;Skin to skin  LATCH Score: 9  Lactation Tools Discussed/Used Tools: Pump Breast pump type: Manual   Consult Status Consult Status: Follow-up Date: 09/05/13 Follow-up type: In-patient    Alfred Levins 09/04/2013, 5:16 PM

## 2013-09-05 ENCOUNTER — Ambulatory Visit: Payer: Self-pay

## 2013-09-05 LAB — TYPE AND SCREEN
ABO/RH(D): O NEG
Antibody Screen: POSITIVE
DAT, IGG: NEGATIVE
Unit division: 0
Unit division: 0

## 2013-09-05 NOTE — Lactation Note (Signed)
This note was copied from the chart of Jaclyn Nayalee Jaclyn. Lactation Consultation Note  Patient Name: Jaclyn Jones Date: 09/05/2013 Reason for consult: Follow-up assessment When LC entered room , baby still latched on at the breast on the right, with swallows, per mom comfortable. Baby took a break , and woke back up while LC present . LC reviewed basics and answered all mom questions regarding  LC plan . LC assessed breast tissue and noted moms areolas to be semi compressible with some edema , instructed on the use shells  In between feedings , also comfort gels ( per mom has already been using them and they are helping). Steps for latching - breast massage , hand express, Pre-pump if needed to make the nipple and areola more elastic for a deep er latch , and using breast compressions with latch until the baby is in a swallowing pattern  And then intermittent. Worked with mom to obtain a depth at the breast , per mom comfortable, and the LC noted increased swallows, and also with breast compressions. Reassured mom the baby was latching well. % weight loss is only 6% , and plenty voids , just waiting for another stool , also jaundice lever  WNL.  Reviewed engorgement prevention and tx . Mom will have a hand pump for D/C with increase flange if needed #27 due to edema.  Mother informed of post-discharge support and given phone number to the lactation department, including services for phone call assistance; out-patient appointments; and breastfeeding support group. List of other breastfeeding resources in the community given in the handout. Encouraged mother to call for problems or concerns related to breastfeeding.   Maternal Data Has patient been taught Hand Expression?: Yes  Feeding Feeding Type: Breast Fed Length of feed: 15 min  LATCH Score/Interventions Latch: Grasps breast easily, tongue down, lips flanged, rhythmical sucking. Intervention(s): Skin to skin;Teach feeding  cues;Waking techniques Intervention(s): Breast compression  Audible Swallowing: Spontaneous and intermittent Intervention(s): Skin to skin Intervention(s): Skin to skin  Type of Nipple: Everted at rest and after stimulation  Comfort (Breast/Nipple): Soft / non-tender  Problem noted: Filling  Hold (Positioning): No assistance needed to correctly position infant at breast. Intervention(s): Breastfeeding basics reviewed;Support Pillows;Position options;Skin to skin  LATCH Score: 10  Lactation Tools Discussed/Used Tools: Shells;Pump;Comfort gels;Flanges Flange Size: 27 Shell Type: Inverted Breast pump type: Manual Initiated by::  (reviewed by Vladimir Crofts Francetta Found )   Consult Status Consult Status: Complete    Jaclyn Jones 09/05/2013, 4:50 PM

## 2014-02-07 ENCOUNTER — Encounter (HOSPITAL_COMMUNITY): Payer: Self-pay

## 2015-02-14 LAB — OB RESULTS CONSOLE GBS: GBS: POSITIVE

## 2015-02-14 LAB — OB RESULTS CONSOLE ABO/RH: RH Type: NEGATIVE

## 2015-02-14 LAB — OB RESULTS CONSOLE HEPATITIS B SURFACE ANTIGEN: HEP B S AG: NEGATIVE

## 2015-02-14 LAB — OB RESULTS CONSOLE RPR: RPR: NONREACTIVE

## 2015-02-14 LAB — OB RESULTS CONSOLE ANTIBODY SCREEN: ANTIBODY SCREEN: NEGATIVE

## 2015-02-14 LAB — OB RESULTS CONSOLE HIV ANTIBODY (ROUTINE TESTING): HIV: NONREACTIVE

## 2015-02-14 LAB — OB RESULTS CONSOLE GC/CHLAMYDIA
CHLAMYDIA, DNA PROBE: NEGATIVE
GC PROBE AMP, GENITAL: NEGATIVE

## 2015-02-14 LAB — OB RESULTS CONSOLE RUBELLA ANTIBODY, IGM: Rubella: IMMUNE

## 2015-05-02 ENCOUNTER — Encounter (HOSPITAL_COMMUNITY): Payer: Self-pay | Admitting: *Deleted

## 2015-05-02 ENCOUNTER — Inpatient Hospital Stay (HOSPITAL_COMMUNITY)
Admission: AD | Admit: 2015-05-02 | Discharge: 2015-05-02 | Disposition: A | Discharge status: Home / Self Care | Payer: BC Managed Care – PPO | Source: Ambulatory Visit | Attending: Obstetrics and Gynecology | Admitting: Obstetrics and Gynecology

## 2015-05-02 DIAGNOSIS — W500XXA Accidental hit or strike by another person, initial encounter: Secondary | ICD-10-CM

## 2015-05-02 DIAGNOSIS — O9A212 Injury, poisoning and certain other consequences of external causes complicating pregnancy, second trimester: Secondary | ICD-10-CM

## 2015-05-02 DIAGNOSIS — O30002 Twin pregnancy, unspecified number of placenta and unspecified number of amniotic sacs, second trimester: Secondary | ICD-10-CM

## 2015-05-02 DIAGNOSIS — O26892 Other specified pregnancy related conditions, second trimester: Secondary | ICD-10-CM | POA: Diagnosis present

## 2015-05-02 DIAGNOSIS — S3991XA Unspecified injury of abdomen, initial encounter: Secondary | ICD-10-CM | POA: Diagnosis not present

## 2015-05-02 DIAGNOSIS — Z3A19 19 weeks gestation of pregnancy: Secondary | ICD-10-CM | POA: Diagnosis not present

## 2015-05-02 NOTE — MAU Note (Signed)
Urine sent to lab @ 2252

## 2015-05-02 NOTE — Discharge Instructions (Signed)
What Do I Need to Know About Injuries During Pregnancy? °Injuries can happen during pregnancy. Minor falls and accidents usually do not harm you or your baby. However, any injury should be reported to your doctor. °WHAT CAN I DO TO PROTECT MYSELF FROM INJURIES? °· Remove rugs and loose objects on the floor. °· Wear comfortable shoes that have a good grip. Do not wear high-heeled shoes. °· Always wear your seat belt. The lap belt should be below your belly. Always practice safe driving. °· Do not ride on a motorcycle. °· Do not participate in high-impact activities or sports. °· Avoid: °¨ Walking on wet or slippery floors. °¨ Fires. °¨ Starting fires. °¨ Lifting heavy pots of boiling or hot liquids. °¨ Fixing electrical problems. °· Only take medicine as told by your doctor. °· Know your blood type and the blood type of the baby's father. °· Call your local emergency services (911 in the [place] if you are a victim of domestic violence or assault. For help and support, contact the National Domestic Violence Hotline. °WHEN SHOULD I GET HELP RIGHT AWAY? °· You fall on your belly or have any high-impact accident or injury. °· You have been a victim of domestic violence or any kind of violence. °· You have been in a car accident. °· You have bleeding from your vagina. °· Fluid is leaking from your vagina. °· You start to have belly cramping (contractions) or pain. °· You feel weak or pass out (faint). °· You start to throw up (vomit) after an injury. °· You have been burned. °· You have a stiff neck or neck pain. °· You get a headache or have vision problems after an injury. °· You do not feel the baby move or the baby is not moving as much as normal. °  °This information is not intended to replace advice given to you by your health care provider. Make sure you discuss any questions you have with your health care provider. °  °Document Released: 04/27/2010 Document Revised: 04/15/2014 Document Reviewed:  12/30/2012 °Elsevier Interactive Patient Education ©2016 [employer] ° °

## 2015-05-02 NOTE — MAU Provider Note (Signed)
History     CSN: [id number]  Arrival date and time: 05/02/15 2235   First Provider Initiated Contact with Patient 05/02/15 2330      No chief complaint on file.  HPI  [name] is a 30 y.o. G2P1001 at [redacted]w[redacted]d with twins who presents to MAU today with complaint of abdominal trauma. The patient states that her 80 month old daughter was standing in her lap and then fell forward and hit her in the LUQ of the abdomen. Initially she states it was painful, but then the pain stopped. She denies pain now. She also denies vaginal bleeding or LOF.   OB History    Gravida Para Term Preterm AB TAB SAB Ectopic Multiple Living   Past Medical History  Diagnosis Date  . GERD (gastroesophageal reflux disease)   . Anxiety   . Vaginal Pap smear, abnormal     bx was normal  . Carpal tunnel syndrome     Past Surgical History  Procedure Laterality Date  . Tonsillectomy    . Adenoidectomy and myringotomy with tube placement      Family History  Problem Relation Age of Onset  . Cancer Maternal Grandmother     lung, smoker  . Diabetes Paternal Grandfather   . Hearing loss Neg Hx     Social History  Substance Use Topics  . Smoking status: Never Smoker   . Smokeless tobacco: Never Used  . Alcohol Use: Yes     Comment: occ, none with preg    Allergies: No Known Allergies  Prescriptions prior to admission  Medication Sig Dispense Refill Last Dose  . calcium carbonate (TUMS - DOSED IN MG ELEMENTAL CALCIUM) 500 MG chewable tablet Chew 1 tablet by mouth daily as needed for indigestion or heartburn.   Past Week at Unknown time  . Prenatal Vit-Fe Fumarate-FA (PRENATAL MULTIVITAMIN) TABS tablet Take 1 tablet by mouth daily at 12 noon.   09/01/2013 at Unknown time    Review of Systems  Constitutional: Negative for fever and malaise/fatigue.  Gastrointestinal: Negative for abdominal pain.  Genitourinary:       Neg - vaginal bleeding, discharge, LOF   Physical Exam    Blood pressure 134/73, pulse 96, temperature 98.7 F (37.1 C), temperature source Oral, resp. rate 18, height  (1.626 m), weight 232 lb (105.235 kg), SpO2 100 %, unknown if currently breastfeeding.  Physical Exam  Nursing note and vitals reviewed. Constitutional: She is oriented to person, place, and time. She appears well-developed and well-nourished. No distress.  HENT:  Head: Normocephalic and atraumatic.  Cardiovascular: Normal rate.   Respiratory: Effort normal.  GI: Soft. She exhibits no distension and no mass. There is no tenderness. There is no rebound and no guarding.  Neurological: She is alert and oriented to person, place, and time.  Skin: Skin is warm and dry. No erythema.  Psychiatric: She has a normal mood and affect.    MAU Course  Procedures None  MDM FHR A - 142 bpm with doppler FHR B - 153 bpm with doppler Discussed with Dr. Renaldo Fiddler. Patient may be discharged with reassurance. If abdominal pain or bleeding present call the office.  Assessment and Plan  A: Twin IUP at [redacted]w[redacted]d Abdominal trauma   P: Discharge home Tylenol PRN fo rpain Second trimester precautions discussed Patient advised to follow-up with [facility] as scheduled for routine prenatal care or  sooner PRN Patient may return to MAU as needed or if her condition were to change or worsen   Marny Lowenstein, PA-C  05/02/2015, 11:41 PM

## 2015-05-02 NOTE — MAU Note (Signed)
Pt states her toddler jumped on her abd at 2100, has had some left side pain since it happened. Denies bleeding.

## 2015-07-07 ENCOUNTER — Encounter (HOSPITAL_COMMUNITY): Payer: Self-pay | Admitting: Anesthesiology

## 2015-07-07 ENCOUNTER — Encounter (HOSPITAL_COMMUNITY)
Admission: RE | Admit: 2015-07-07 | Discharge: 2015-07-07 | Disposition: A | Discharge status: Home / Self Care | Payer: BC Managed Care – PPO | Source: Ambulatory Visit | Attending: Obstetrics and Gynecology | Admitting: Obstetrics and Gynecology

## 2015-07-08 ENCOUNTER — Encounter (HOSPITAL_COMMUNITY): Payer: Self-pay | Admitting: Anesthesiology

## 2015-07-08 NOTE — Anesthesia Preprocedure Evaluation (Deleted)
Anesthesia Evaluation  Patient identified by MRN, date of birth, ID band Patient awake    Reviewed: Allergy & Precautions, Patient's Chart, lab work & pertinent test results  Airway Mallampati: III  TM Distance: >3 FB Neck ROM: Full    Dental no notable dental hx. (+) Teeth Intact   Pulmonary neg pulmonary ROS,    Pulmonary exam normal breath sounds clear to auscultation       Cardiovascular negative cardio ROS Normal cardiovascular exam Rhythm:Regular Rate:Normal     Neuro/Psych PSYCHIATRIC DISORDERS Anxiety Bilateral CTS  Neuromuscular disease    GI/Hepatic Neg liver ROS, GERD  Medicated,  Endo/Other  Morbid obesity  Renal/GU negative Renal ROS  negative genitourinary   Musculoskeletal negative musculoskeletal ROS (+)   Abdominal (+) + obese,   Peds  Hematology  (+) anemia ,   Anesthesia Other Findings   Reproductive/Obstetrics Twin Gestation 28 weeks Borderline Polyhdramnios                            Anesthesia Physical Anesthesia Plan  ASA: III  Anesthesia Plan: Spinal   Post-op Pain Management:    Induction:   Airway Management Planned: Natural Airway  Additional Equipment:   Intra-op Plan:   Post-operative Plan:   Informed Consent:   Dental advisory given  Plan Discussed with: Anesthesiologist and Surgeon  Anesthesia Plan Comments: (I saw Ms. Barhorst at the request of Dr. Henderson Cloudomblin and the patient. She had a previous vaginal delivery with epidural analgesia and developed "severe hypotension" after initiation of the epidural. She reported her BP was in the "single digits". She was significantly symptomatic and reported that she did not respond to all the vasopressors she was given. She is concerned about receiving any type of anesthesia and the reaction she might have to anesthetic drugs and would not respond to resuscitative efforts should she develop hypotension  again. She is tentatively scheduled for a primary C/Section on 09/12/2015. The positions of the babies have been changing throughout her pregnancy. She is still contemplating a vaginal birth if the babies' position is favorable at the time of labor or delivery. I had a long discussion with the patient and her husband with regards to her anesthesia and her fears. We discussed Epidural placement for vaginal delivery, SAB placement for C/Section and also GA. We discussed the risks, benefits and alternatives of each. I reassured her that every pregnancy is different as well as the response to the anesthesia. I told her I doubted what happened last time after her epidural placement would happen again.  She seemed more at ease after our conversation and my explanation to her and her husband. We will formulate a more concrete plan for her anesthesia when she presents either in labor or for her scheduled C/Section. She will have further discussions with her OB providers regarding possible vaginal delivery. She does understand that multiple gestations are a high risk pregnancy. I spent 30 min face to face with her for this consultation.)       Anesthesia Quick Evaluation

## 2015-08-01 ENCOUNTER — Ambulatory Visit (INDEPENDENT_AMBULATORY_CARE_PROVIDER_SITE_OTHER): Payer: BC Managed Care – PPO | Admitting: *Deleted

## 2015-08-01 VITALS — BP 124/75 | HR 103

## 2015-08-01 DIAGNOSIS — IMO0001 Reserved for inherently not codable concepts without codable children: Secondary | ICD-10-CM

## 2015-08-01 DIAGNOSIS — O30003 Twin pregnancy, unspecified number of placenta and unspecified number of amniotic sacs, third trimester: Secondary | ICD-10-CM

## 2015-08-01 NOTE — Progress Notes (Signed)
Here for NST Twins. Going to appt with [facility] immediately after NST, will take copy of nst with her for physician.

## 2015-08-09 ENCOUNTER — Ambulatory Visit (INDEPENDENT_AMBULATORY_CARE_PROVIDER_SITE_OTHER): Payer: BC Managed Care – PPO | Admitting: General Practice

## 2015-08-09 VITALS — BP 114/79 | HR 113

## 2015-08-09 DIAGNOSIS — O30003 Twin pregnancy, unspecified number of placenta and unspecified number of amniotic sacs, third trimester: Secondary | ICD-10-CM | POA: Diagnosis not present

## 2015-08-09 NOTE — Progress Notes (Signed)
Patient is seeing Dr Renaldo FiddlerAdkins today. Copy of report & tracing sent with patient

## 2015-08-16 ENCOUNTER — Ambulatory Visit (INDEPENDENT_AMBULATORY_CARE_PROVIDER_SITE_OTHER): Payer: BC Managed Care – PPO | Admitting: *Deleted

## 2015-08-16 VITALS — BP 126/79 | HR 111

## 2015-08-16 DIAGNOSIS — O30043 Twin pregnancy, dichorionic/diamniotic, third trimester: Secondary | ICD-10-CM

## 2015-08-16 NOTE — Progress Notes (Signed)
Copy of report and NST tracing sent to Dr. Morris w/pt today.  

## 2015-08-18 ENCOUNTER — Inpatient Hospital Stay (HOSPITAL_COMMUNITY): Payer: BC Managed Care – PPO

## 2015-08-18 ENCOUNTER — Encounter (HOSPITAL_COMMUNITY): Payer: Self-pay

## 2015-08-18 ENCOUNTER — Inpatient Hospital Stay (HOSPITAL_COMMUNITY)
Admission: AD | Admit: 2015-08-18 | Discharge: 2015-08-18 | Disposition: A | Discharge status: Home / Self Care | Payer: BC Managed Care – PPO | Source: Ambulatory Visit | Attending: Obstetrics and Gynecology | Admitting: Obstetrics and Gynecology

## 2015-08-18 DIAGNOSIS — Z3A34 34 weeks gestation of pregnancy: Secondary | ICD-10-CM | POA: Diagnosis not present

## 2015-08-18 DIAGNOSIS — O368190 Decreased fetal movements, unspecified trimester, not applicable or unspecified: Secondary | ICD-10-CM

## 2015-08-18 DIAGNOSIS — O30003 Twin pregnancy, unspecified number of placenta and unspecified number of amniotic sacs, third trimester: Secondary | ICD-10-CM | POA: Insufficient documentation

## 2015-08-18 DIAGNOSIS — O368130 Decreased fetal movements, third trimester, not applicable or unspecified: Secondary | ICD-10-CM | POA: Insufficient documentation

## 2015-08-18 IMAGING — US USMFM FETAL BPP W/O NON-STRESS ADDL GEST
1 series · 14 of 28 positions shown · non-contrast
Comparison: none

[Series 1: usmfm fetal bpp w/o non-stress addl gest · 35 acquisitions, 14 frames shown]
[im 2/35]
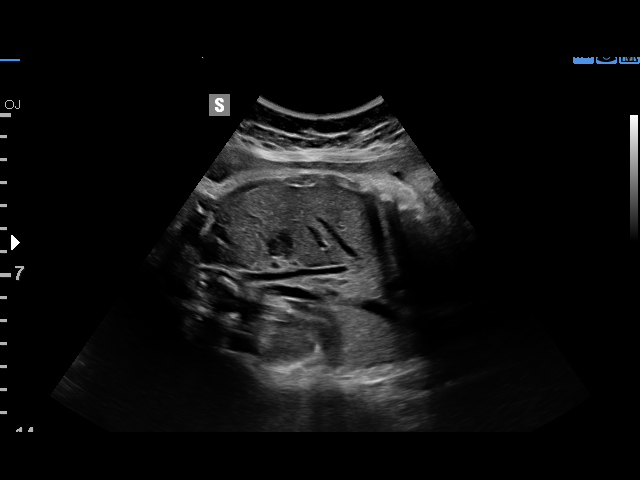
[im 4/35]
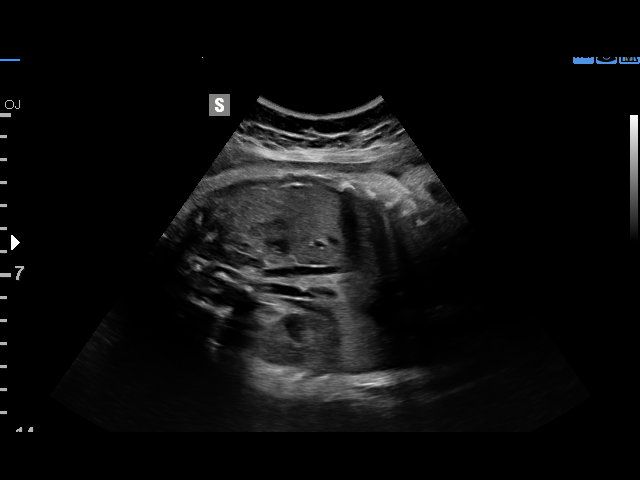
[im 7/35]
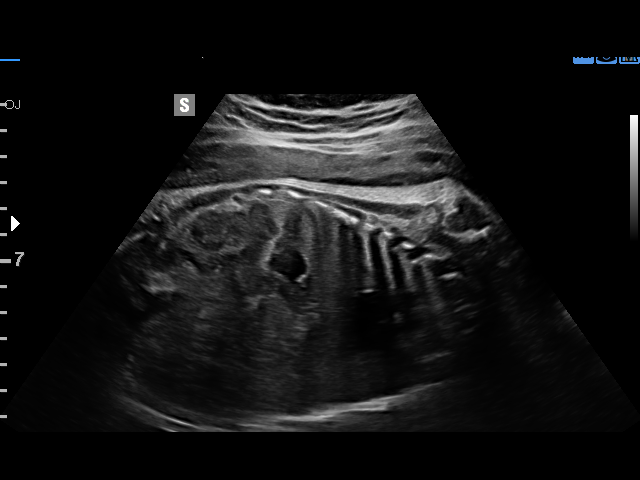
[im 9/35]
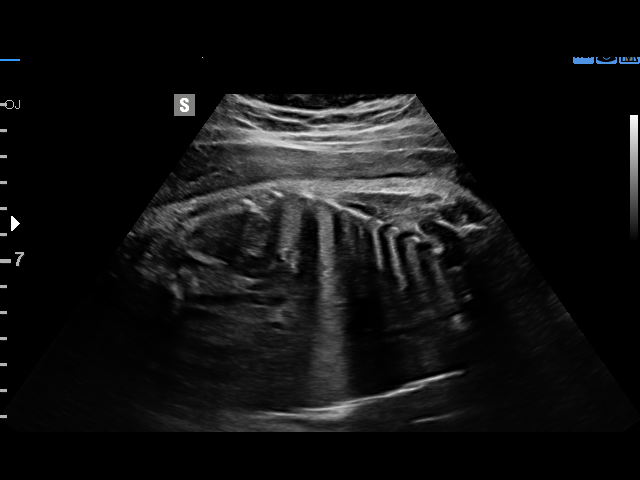
[im 12/35]
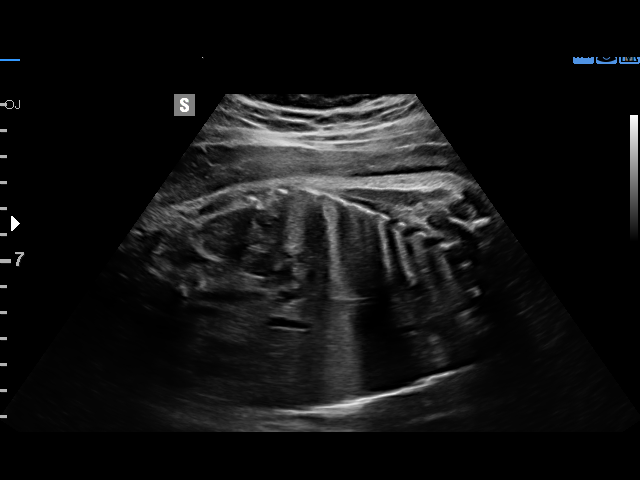
[im 14/35]
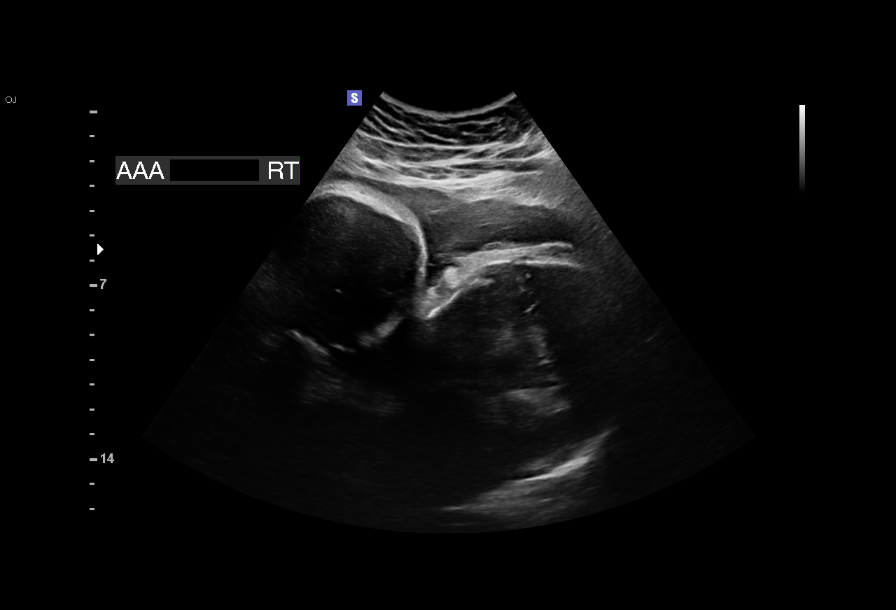
[im 17/35]
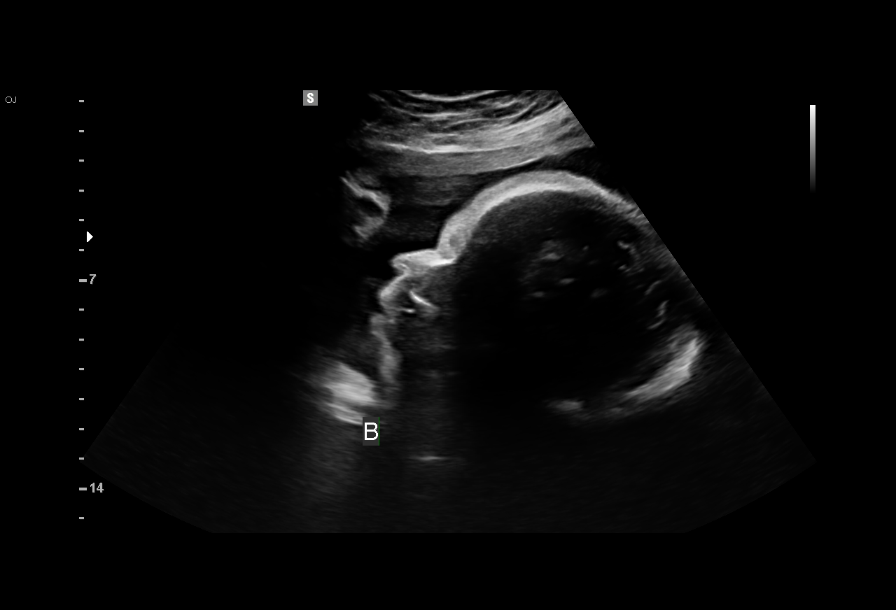
[im 19/35]
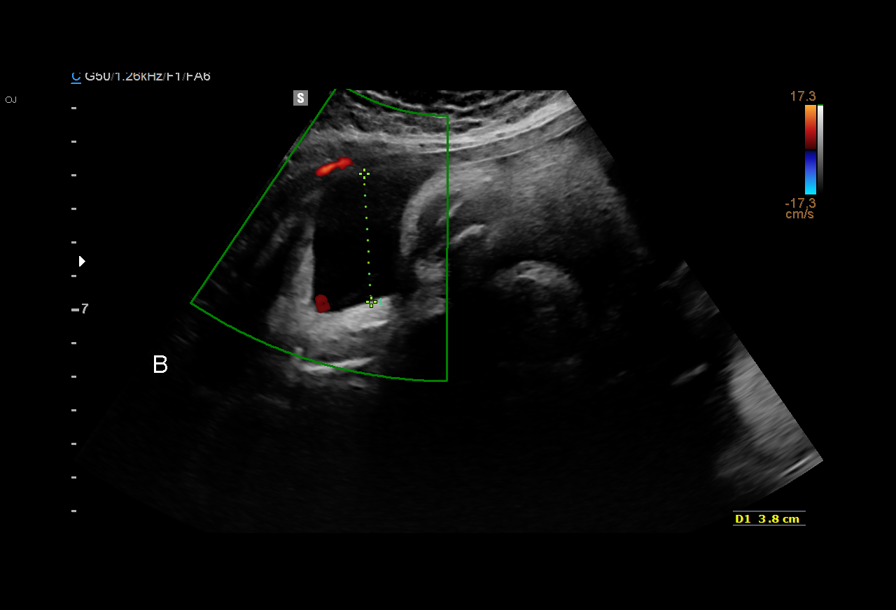
[im 22/35]
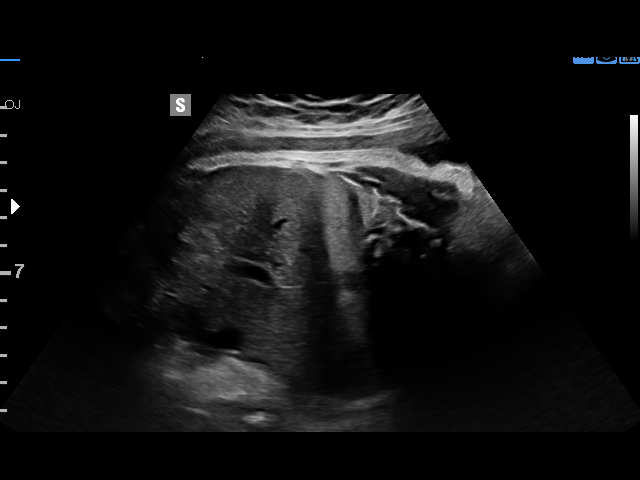
[im 24/35]
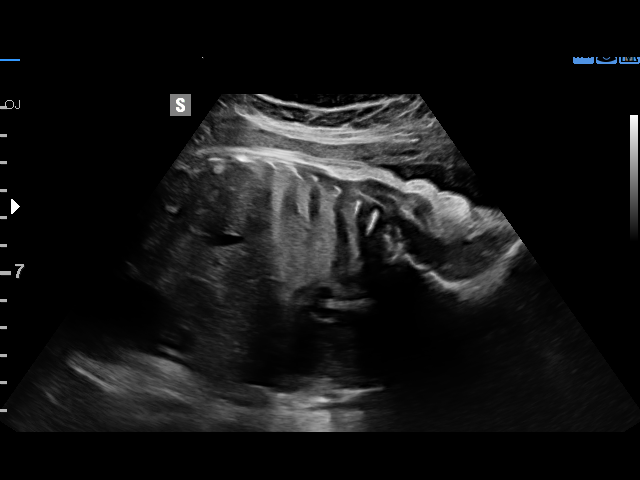
[im 27/35]
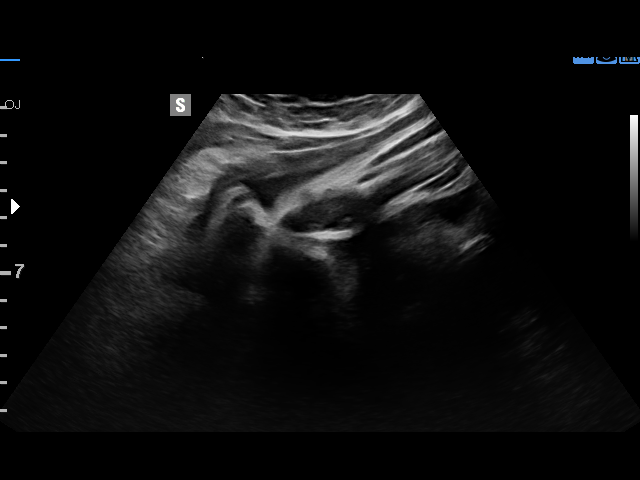
[im 29/35]
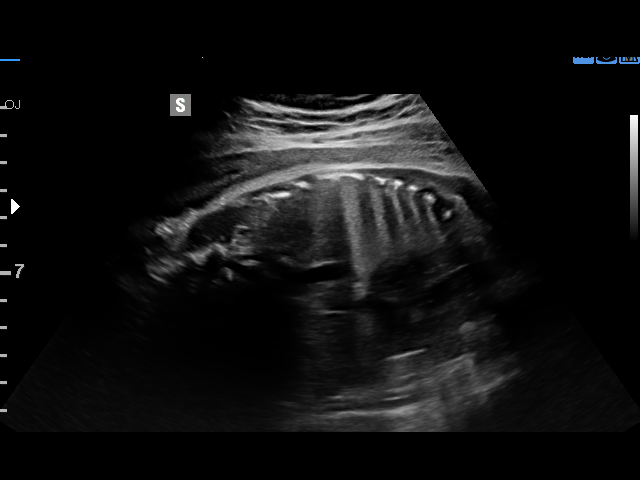
[im 32/35]
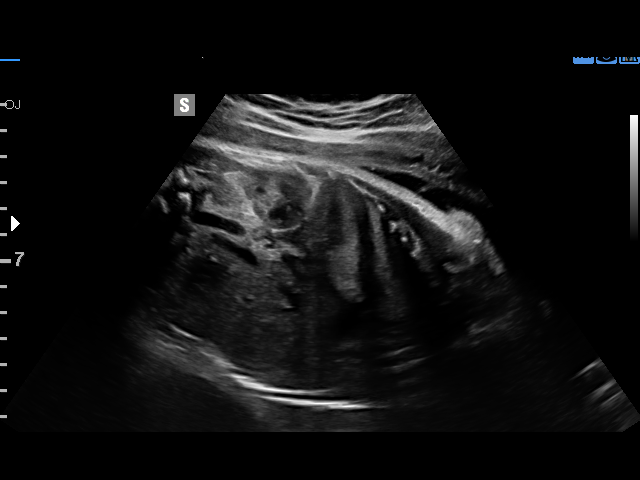
[im 35/35]
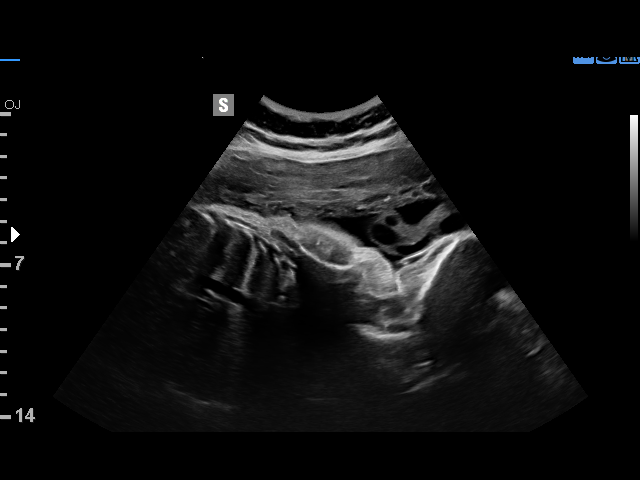

[14 of 28 positions shown; findings below may reference images not displayed]

GESTATION

1  LABELLE SALHA                [PHONE_NUMBER]      [PHONE_NUMBER]     [PHONE_NUMBER]
2  LABELLE SALHA                [PHONE_NUMBER]      [PHONE_NUMBER]     [PHONE_NUMBER]
Indications

34 weeks gestation of pregnancy
Decreased fetal movement                       [X3]
Twin pregnancy
OB History

Gravidity:    2         Term:   1        Prem:   0        SAB:   0
TOP:          0       Ectopic:  0        Living: 1
Fetal Evaluation (Fetus A)

Num Of Fetuses:     2
Fetal Heart         129
Rate(bpm):
Cardiac Activity:   Observed
Fetal Lie:          Lower right
Presentation:       Cephalic

Amniotic Fluid
AFI FV:      Subjectively within normal limits

Largest Pocket(cm)
3.1
Biophysical Evaluation (Fetus A)

Amniotic F.V:   Within normal limits       F. Tone:        Observed
F. Movement:    Observed                   Score:          [DATE]
F. Breathing:   Observed
Gestational Age (Fetus A)
LMP:           34w 3d        Date:  [DATE]                 EDD:   [DATE]
Best:          34w 3d     Det. By:  LMP  ([DATE])          EDD:   [DATE]

Fetal Evaluation (Fetus B)

Num Of Fetuses:     2
Fetal Heart         150
Rate(bpm):
Cardiac Activity:   Observed
Fetal Lie:          Upper Left
Presentation:       Cephalic

Amniotic Fluid
AFI FV:      Subjectively within normal limits

Largest Pocket(cm)
3.8
Biophysical Evaluation (Fetus B)

Amniotic F.V:   Within normal limits       F. Tone:        Observed
F. Movement:    Observed                   Score:          [DATE]
F. Breathing:   Observed
Gestational Age (Fetus B)

LMP:           34w 3d        Date:  [DATE]                 EDD:   [DATE]
Best:          34w 3d     Det. By:  LMP  ([DATE])          EDD:   [DATE]
Cervix Uterus Adnexa

Cervix
Not visualized (advanced GA >[X3])
Impression

Twin intrauterine pregnancy of unknown chorionicity at 34
weeks 3 days.
Normal amniotic fluid volume x2.
BPP [DATE] x2.
Recommendations

Follow-up ultrasounds as clinically indicated.

## 2015-08-18 NOTE — MAU Provider Note (Signed)
MAU HISTORY AND PHYSICAL  Chief Complaint:  Decreased Fetal Movement   Jaclyn Jones is a 30 y.o.  G2P1001 with IUP at 6295w3d presenting for Decreased Fetal Movement  Began last night. Noticed hiccups and lots of movement, then felt a pulsing sensation left side of stomach. Throughout night however noticed less movement than normal, and this morning only baby a moving like normal, b less than normal. No bleeding, no leakage of fluid, no contractions. No recent abdominal trauma. Denies fever, n/v. No previous concerns for decreased movement. Weekly nsts have been reactive. Next appt Tuesday.   Past Medical History  Diagnosis Date  . GERD (gastroesophageal reflux disease)   . Anxiety   . Vaginal Pap smear, abnormal     bx was normal  . Carpal tunnel syndrome     Past Surgical History  Procedure Laterality Date  . Tonsillectomy    . Adenoidectomy and myringotomy with tube placement      Family History  Problem Relation Age of Onset  . Cancer Maternal Grandmother     lung, smoker  . Diabetes Paternal Grandfather   . Hearing loss Neg Hx     Social History  Substance Use Topics  . Smoking status: Never Smoker   . Smokeless tobacco: Never Used  . Alcohol Use: Yes     Comment: occ, none with preg    Allergies  Allergen Reactions  . Fentanyl-Bupivacaine-Nacl     Pt's blood pressure dropped when on this medication    Prescriptions prior to admission  Medication Sig Dispense Refill Last Dose  . Prenatal Vit-Fe Fumarate-FA (PRENATAL MULTIVITAMIN) TABS tablet Take 1 tablet by mouth daily at 12 noon.   08/16/2015 at Unknown time  . ranitidine (ZANTAC) 75 MG tablet Take 75-150 mg by mouth 2 (two) times daily as needed for heartburn.   08/17/2015 at Unknown time    Review of Systems - Negative except for what is mentioned in HPI.  Physical Exam  Blood pressure 127/81, pulse 98, temperature 98.3 F (36.8 C), temperature source Oral, resp. rate 18, unknown if currently  breastfeeding. GENERAL: Well-developed, well-nourished female in no acute distress.  LUNGS: Clear to auscultation bilaterally.  HEART: Regular rate and rhythm. ABDOMEN: Soft, nontender, nondistended, gravid.  EXTREMITIES: Nontender, no edema, 2+ distal pulses. Cervical Exam: deferred FHT:  135/mod/+a/-d; 150/mod/+a/-3 Contractions: none   Labs: No results found for this or any previous visit (from the past 24 hour(s)).  Imaging Studies:  No results found.  Assessment: Jaclyn Jones is  30 y.o. G2P1001 at 4195w3d presents with Decreased Fetal Movement Here positive fetal movement, reactive nst for both babies, and 8/8 BPPs both babies.  Plan: - home w/ ob f/u next week as scheduled, kick counts, and ptl/pprom/abruption return precautions  Silvano Bilisoah B Jaclyn Jones 5/12/201712:32 PM

## 2015-08-18 NOTE — MAU Note (Signed)
Patient presents with c/o decreased fetal movement for Baby B, twin gestation, both were cephalic.

## 2015-08-18 NOTE — Discharge Instructions (Signed)
Fetal Movement Counts  Patient Name: __________________________________________________ Patient Due Date: ____________________  Performing a fetal movement count is highly recommended in high-risk pregnancies, but it is good for every pregnant woman to do. Your health care provider may ask you to start counting fetal movements at 28 weeks of the pregnancy. Fetal movements often increase:  · After eating a full meal.  · After physical activity.  · After eating or drinking something sweet or cold.  · At rest.  Pay attention to when you feel the baby is most active. This will help you notice a pattern of your baby's sleep and wake cycles and what factors contribute to an increase in fetal movement. It is important to perform a fetal movement count at the same time each day when your baby is normally most active.   HOW TO COUNT FETAL MOVEMENTS  1. Find a quiet and comfortable area to sit or lie down on your left side. Lying on your left side provides the best blood and oxygen circulation to your baby.  2. Write down the day and time on a sheet of paper or in a journal.  3. Start counting kicks, flutters, swishes, rolls, or jabs in a 2-hour period. You should feel at least 10 movements within 2 hours.  4. If you do not feel 10 movements in 2 hours, wait 2-3 hours and count again. Look for a change in the pattern or not enough counts in 2 hours.  SEEK MEDICAL CARE IF:  · You feel less than 10 counts in 2 hours, tried twice.  · There is no movement in over an hour.  · The pattern is changing or taking longer each day to reach 10 counts in 2 hours.  · You feel the baby is not moving as he or she usually does.  Date: ____________ Movements: ____________ Start time: ____________ Finish time: ____________   Date: ____________ Movements: ____________ Start time: ____________ Finish time: ____________  Date: ____________ Movements: ____________ Start time: ____________ Finish time: ____________  Date: ____________ Movements:  ____________ Start time: ____________ Finish time: ____________  Date: ____________ Movements: ____________ Start time: ____________ Finish time: ____________  Date: ____________ Movements: ____________ Start time: ____________ Finish time: ____________  Date: ____________ Movements: ____________ Start time: ____________ Finish time: ____________  Date: ____________ Movements: ____________ Start time: ____________ Finish time: ____________   Date: ____________ Movements: ____________ Start time: ____________ Finish time: ____________  Date: ____________ Movements: ____________ Start time: ____________ Finish time: ____________  Date: ____________ Movements: ____________ Start time: ____________ Finish time: ____________  Date: ____________ Movements: ____________ Start time: ____________ Finish time: ____________  Date: ____________ Movements: ____________ Start time: ____________ Finish time: ____________  Date: ____________ Movements: ____________ Start time: ____________ Finish time: ____________  Date: ____________ Movements: ____________ Start time: ____________ Finish time: ____________   Date: ____________ Movements: ____________ Start time: ____________ Finish time: ____________  Date: ____________ Movements: ____________ Start time: ____________ Finish time: ____________  Date: ____________ Movements: ____________ Start time: ____________ Finish time: ____________  Date: ____________ Movements: ____________ Start time: ____________ Finish time: ____________  Date: ____________ Movements: ____________ Start time: ____________ Finish time: ____________  Date: ____________ Movements: ____________ Start time: ____________ Finish time: ____________  Date: ____________ Movements: ____________ Start time: ____________ Finish time: ____________   Date: ____________ Movements: ____________ Start time: ____________ Finish time: ____________  Date: ____________ Movements: ____________ Start time: ____________ Finish  time: ____________  Date: ____________ Movements: ____________ Start time: ____________ Finish time: ____________  Date: ____________ Movements: ____________ Start time:   ____________ Finish time: ____________  Date: ____________ Movements: ____________ Start time: ____________ Finish time: ____________  Date: ____________ Movements: ____________ Start time: ____________ Finish time: ____________  Date: ____________ Movements: ____________ Start time: ____________ Finish time: ____________   Date: ____________ Movements: ____________ Start time: ____________ Finish time: ____________  Date: ____________ Movements: ____________ Start time: ____________ Finish time: ____________  Date: ____________ Movements: ____________ Start time: ____________ Finish time: ____________  Date: ____________ Movements: ____________ Start time: ____________ Finish time: ____________  Date: ____________ Movements: ____________ Start time: ____________ Finish time: ____________  Date: ____________ Movements: ____________ Start time: ____________ Finish time: ____________  Date: ____________ Movements: ____________ Start time: ____________ Finish time: ____________   Date: ____________ Movements: ____________ Start time: ____________ Finish time: ____________  Date: ____________ Movements: ____________ Start time: ____________ Finish time: ____________  Date: ____________ Movements: ____________ Start time: ____________ Finish time: ____________  Date: ____________ Movements: ____________ Start time: ____________ Finish time: ____________  Date: ____________ Movements: ____________ Start time: ____________ Finish time: ____________  Date: ____________ Movements: ____________ Start time: ____________ Finish time: ____________  Date: ____________ Movements: ____________ Start time: ____________ Finish time: ____________   Date: ____________ Movements: ____________ Start time: ____________ Finish time: ____________  Date: ____________  Movements: ____________ Start time: ____________ Finish time: ____________  Date: ____________ Movements: ____________ Start time: ____________ Finish time: ____________  Date: ____________ Movements: ____________ Start time: ____________ Finish time: ____________  Date: ____________ Movements: ____________ Start time: ____________ Finish time: ____________  Date: ____________ Movements: ____________ Start time: ____________ Finish time: ____________  Date: ____________ Movements: ____________ Start time: ____________ Finish time: ____________   Date: ____________ Movements: ____________ Start time: ____________ Finish time: ____________  Date: ____________ Movements: ____________ Start time: ____________ Finish time: ____________  Date: ____________ Movements: ____________ Start time: ____________ Finish time: ____________  Date: ____________ Movements: ____________ Start time: ____________ Finish time: ____________  Date: ____________ Movements: ____________ Start time: ____________ Finish time: ____________  Date: ____________ Movements: ____________ Start time: ____________ Finish time: ____________     This information is not intended to replace advice given to you by your health care provider. Make sure you discuss any questions you have with your health care provider.     Document Released: 04/24/2006 Document Revised: 04/15/2014 Document Reviewed: 01/20/2012  Elsevier Interactive Patient Education ©2016 [employer]

## 2015-08-18 NOTE — MAU Note (Signed)
Patient taken off efm, both reactive, per Dr. Ashok PallWouk.

## 2015-08-22 ENCOUNTER — Ambulatory Visit (INDEPENDENT_AMBULATORY_CARE_PROVIDER_SITE_OTHER): Payer: BC Managed Care – PPO | Admitting: *Deleted

## 2015-08-22 DIAGNOSIS — O30003 Twin pregnancy, unspecified number of placenta and unspecified number of amniotic sacs, third trimester: Secondary | ICD-10-CM

## 2015-08-22 NOTE — Progress Notes (Signed)
Copy of report and NST tracing sent to Dr. Tomblin w/pt today.  

## 2015-08-25 ENCOUNTER — Other Ambulatory Visit: Payer: BC Managed Care – PPO

## 2015-08-29 ENCOUNTER — Ambulatory Visit (INDEPENDENT_AMBULATORY_CARE_PROVIDER_SITE_OTHER): Payer: BC Managed Care – PPO | Admitting: *Deleted

## 2015-08-29 VITALS — BP 124/83 | HR 119

## 2015-08-29 DIAGNOSIS — O30093 Twin pregnancy, unable to determine number of placenta and number of amniotic sacs, third trimester: Secondary | ICD-10-CM | POA: Diagnosis not present

## 2015-08-29 NOTE — Progress Notes (Signed)
Copy of report and NST tracing sent to Dr. Tomblin w/pt today.  

## 2015-08-30 ENCOUNTER — Telehealth (HOSPITAL_COMMUNITY): Payer: Self-pay | Admitting: *Deleted

## 2015-08-30 ENCOUNTER — Encounter (HOSPITAL_COMMUNITY): Payer: Self-pay | Admitting: *Deleted

## 2015-08-30 NOTE — Telephone Encounter (Signed)
Preadmission screen  

## 2015-08-31 ENCOUNTER — Encounter (HOSPITAL_COMMUNITY): Payer: Self-pay | Admitting: *Deleted

## 2015-08-31 ENCOUNTER — Inpatient Hospital Stay (HOSPITAL_COMMUNITY)
Admission: AD | Admit: 2015-08-31 | Discharge: 2015-08-31 | Disposition: A | Discharge status: Home / Self Care | Payer: BC Managed Care – PPO | Source: Ambulatory Visit | Attending: Obstetrics and Gynecology | Admitting: Obstetrics and Gynecology

## 2015-08-31 DIAGNOSIS — R51 Headache: Secondary | ICD-10-CM | POA: Diagnosis not present

## 2015-08-31 DIAGNOSIS — Z3A36 36 weeks gestation of pregnancy: Secondary | ICD-10-CM | POA: Diagnosis not present

## 2015-08-31 DIAGNOSIS — O30003 Twin pregnancy, unspecified number of placenta and unspecified number of amniotic sacs, third trimester: Secondary | ICD-10-CM | POA: Diagnosis not present

## 2015-08-31 DIAGNOSIS — O26893 Other specified pregnancy related conditions, third trimester: Secondary | ICD-10-CM

## 2015-08-31 LAB — URINALYSIS, ROUTINE W REFLEX MICROSCOPIC
Bilirubin Urine: NEGATIVE
GLUCOSE, UA: NEGATIVE mg/dL
Hgb urine dipstick: NEGATIVE
Ketones, ur: 40 mg/dL — AB
NITRITE: NEGATIVE
PH: 6.5 (ref 5.0–8.0)
Protein, ur: NEGATIVE mg/dL
SPECIFIC GRAVITY, URINE: 1.015 (ref 1.005–1.030)

## 2015-08-31 LAB — COMPREHENSIVE METABOLIC PANEL
ALBUMIN: 2.7 g/dL — AB (ref 3.5–5.0)
ALT: 14 U/L (ref 14–54)
AST: 19 U/L (ref 15–41)
Alkaline Phosphatase: 154 U/L — ABNORMAL HIGH (ref 38–126)
Anion gap: 7 (ref 5–15)
BUN: 7 mg/dL (ref 6–20)
CHLORIDE: 108 mmol/L (ref 101–111)
CO2: 22 mmol/L (ref 22–32)
CREATININE: 0.66 mg/dL (ref 0.44–1.00)
Calcium: 8.3 mg/dL — ABNORMAL LOW (ref 8.9–10.3)
GFR calc non Af Amer: >60 mL/min (ref >60)
GLUCOSE: 86 mg/dL (ref 65–99)
Potassium: 3.5 mmol/L (ref 3.5–5.1)
SODIUM: 137 mmol/L (ref 135–145)
Total Bilirubin: 0.6 mg/dL (ref 0.3–1.2)
Total Protein: 5.7 g/dL — ABNORMAL LOW (ref 6.5–8.1)

## 2015-08-31 LAB — CBC
HCT: 30.1 % — ABNORMAL LOW (ref 36.0–46.0)
Hemoglobin: 9.4 g/dL — ABNORMAL LOW (ref 12.0–15.0)
MCH: 26 pg (ref 26.0–34.0)
MCHC: 31.2 g/dL (ref 30.0–36.0)
MCV: 83.1 fL (ref 78.0–100.0)
PLATELETS: 144 10*3/uL — AB (ref 150–400)
RBC: 3.62 MIL/uL — AB (ref 3.87–5.11)
RDW: 15.3 % (ref 11.5–15.5)
WBC: 10.6 10*3/uL — AB (ref 4.0–10.5)

## 2015-08-31 LAB — URINE MICROSCOPIC-ADD ON

## 2015-08-31 MED ORDER — HYDROMORPHONE HCL 1 MG/ML IJ SOLN
1.0000 mg | Freq: Once | INTRAMUSCULAR | Status: AC
  Administered 2015-08-31: 1 mg via INTRAVENOUS
  Filled 2015-08-31: qty 1

## 2015-08-31 MED ORDER — CYCLOBENZAPRINE HCL 10 MG PO TABS
10.0000 mg | ORAL_TABLET | Freq: Once | ORAL | Status: AC
  Administered 2015-08-31: 10 mg via ORAL
  Filled 2015-08-31: qty 1

## 2015-08-31 MED ORDER — LACTATED RINGERS IV BOLUS (SEPSIS)
1000.0000 mL | Freq: Once | INTRAVENOUS | Status: AC
  Administered 2015-08-31: 1000 mL via INTRAVENOUS

## 2015-08-31 NOTE — MAU Note (Signed)
Pt presents complaining of HA since Monday that is relieved with tylenol but it still comes back. Monitoring BP at home and all fine. HA worse with movement and bending over. Pain in back with coughing. Frequent urination and worried about UTI. States she drinks water constantly but always feels thirsty and is worried about dehydration. Increased swelling in hands. Denies bleeding or leaking. Denies contractions.

## 2015-08-31 NOTE — Discharge Instructions (Signed)
Third Trimester of Pregnancy °The third trimester is from week 29 through week 42, months 7 through 9. The third trimester is a time when the fetus is growing rapidly. At the end of the ninth month, the fetus is about 20 inches in length and weighs 6-10 pounds.  °BODY CHANGES °Your body goes through many changes during pregnancy. The changes vary from woman to woman.  °· Your weight will continue to increase. You can expect to gain 25-35 pounds (11-16 kg) by the end of the pregnancy. °· You may begin to get stretch marks on your hips, abdomen, and breasts. °· You may urinate more often because the fetus is moving lower into your pelvis and pressing on your bladder. °· You may develop or continue to have heartburn as a result of your pregnancy. °· You may develop constipation because certain hormones are causing the muscles that push waste through your intestines to slow down. °· You may develop hemorrhoids or swollen, bulging veins (varicose veins). °· You may have pelvic pain because of the weight gain and pregnancy hormones relaxing your joints between the bones in your pelvis. Backaches may result from overexertion of the muscles supporting your posture. °· You may have changes in your hair. These can include thickening of your hair, rapid growth, and changes in texture. Some women also have hair loss during or after pregnancy, or hair that feels dry or thin. Your hair will most likely return to normal after your baby is born. °· Your breasts will continue to grow and be tender. A yellow discharge may leak from your breasts called colostrum. °· Your belly button may stick out. °· You may feel short of breath because of your expanding uterus. °· You may notice the fetus "dropping," or moving lower in your abdomen. °· You may have a bloody mucus discharge. This usually occurs a few days to a week before labor begins. °· Your cervix becomes thin and soft (effaced) near your due date. °WHAT TO EXPECT AT YOUR PRENATAL  EXAMS  °You will have prenatal exams every 2 weeks until week 36. Then, you will have weekly prenatal exams. During a routine prenatal visit: °· You will be weighed to make sure you and the fetus are growing normally. °· Your blood pressure is taken. °· Your abdomen will be measured to track your baby's growth. °· The fetal heartbeat will be listened to. °· Any test results from the previous visit will be discussed. °· You may have a cervical check near your due date to see if you have effaced. °At around 36 weeks, your caregiver will check your cervix. At the same time, your caregiver will also perform a test on the secretions of the vaginal tissue. This test is to determine if a type of bacteria, Group B streptococcus, is present. Your caregiver will explain this further. °Your caregiver may ask you: °· What your birth plan is. °· How you are feeling. °· If you are feeling the baby move. °· If you have had any abnormal symptoms, such as leaking fluid, bleeding, severe headaches, or abdominal cramping. °· If you are using any tobacco products, including cigarettes, chewing tobacco, and electronic cigarettes. °· If you have any questions. °Other tests or screenings that may be performed during your third trimester include: °· Blood tests that check for low iron levels (anemia). °· Fetal testing to check the health, activity level, and growth of the fetus. Testing is done if you have certain medical conditions or if   there are problems during the pregnancy. °· HIV (human immunodeficiency virus) testing. If you are at high risk, you may be screened for HIV during your third trimester of pregnancy. °FALSE LABOR °You may feel small, irregular contractions that eventually go away. These are called Braxton Hicks contractions, or false labor. Contractions may last for hours, days, or even weeks before true labor sets in. If contractions come at regular intervals, intensify, or become painful, it is best to be seen by your  caregiver.  °SIGNS OF LABOR  °· Menstrual-like cramps. °· Contractions that are 5 minutes apart or less. °· Contractions that start on the top of the uterus and spread down to the lower abdomen and back. °· A sense of increased pelvic pressure or back pain. °· A watery or bloody mucus discharge that comes from the vagina. °If you have any of these signs before the 37th week of pregnancy, call your caregiver right away. You need to go to the hospital to get checked immediately. °HOME CARE INSTRUCTIONS  °· Avoid all smoking, herbs, alcohol, and unprescribed drugs. These chemicals affect the formation and growth of the baby. °· Do not use any tobacco products, including cigarettes, chewing tobacco, and electronic cigarettes. If you need help quitting, ask your health care provider. You may receive counseling support and other resources to help you quit. °· Follow your caregiver's instructions regarding medicine use. There are medicines that are either safe or unsafe to take during pregnancy. °· Exercise only as directed by your caregiver. Experiencing uterine cramps is a good sign to stop exercising. °· Continue to eat regular, healthy meals. °· Wear a good support bra for breast tenderness. °· Do not use hot tubs, steam rooms, or saunas. °· Wear your seat belt at all times when driving. °· Avoid raw meat, uncooked cheese, cat litter boxes, and soil used by cats. These carry germs that can cause birth defects in the baby. °· Take your prenatal vitamins. °· Take 1500-2000 mg of calcium daily starting at the 20th week of pregnancy until you deliver your baby. °· Try taking a stool softener (if your caregiver approves) if you develop constipation. Eat more high-fiber foods, such as fresh vegetables or fruit and whole grains. Drink plenty of fluids to keep your urine clear or pale yellow. °· Take warm sitz baths to soothe any pain or discomfort caused by hemorrhoids. Use hemorrhoid cream if your caregiver approves. °· If  you develop varicose veins, wear support hose. Elevate your feet for 15 minutes, 3-4 times a day. Limit salt in your diet. °· Avoid heavy lifting, wear low heal shoes, and practice good posture. °· Rest a lot with your legs elevated if you have leg cramps or low back pain. °· Visit your dentist if you have not gone during your pregnancy. Use a soft toothbrush to brush your teeth and be gentle when you floss. °· A sexual relationship may be continued unless your caregiver directs you otherwise. °· Do not travel far distances unless it is absolutely necessary and only with the approval of your caregiver. °· Take prenatal classes to understand, practice, and ask questions about the labor and delivery. °· Make a trial run to the hospital. °· Pack your hospital bag. °· Prepare the baby's nursery. °· Continue to go to all your prenatal visits as directed by your caregiver. °SEEK MEDICAL CARE IF: °· You are unsure if you are in labor or if your water has broken. °· You have dizziness. °· You have   mild pelvic cramps, pelvic pressure, or nagging pain in your abdominal area.  You have persistent nausea, vomiting, or diarrhea.  You have a bad smelling vaginal discharge.  You have pain with urination. SEEK IMMEDIATE MEDICAL CARE IF:   You have a fever.  You are leaking fluid from your vagina.  You have spotting or bleeding from your vagina.  You have severe abdominal cramping or pain.  You have rapid weight loss or gain.  You have shortness of breath with chest pain.  You notice sudden or extreme swelling of your face, hands, ankles, feet, or legs.  You have not felt your baby move in over an hour.  You have severe headaches that do not go away with medicine.  You have vision changes.   This information is not intended to replace advice given to you by your health care provider. Make sure you discuss any questions you have with your health care provider.   Document Released: 03/19/2001 Document  Revised: 04/15/2014 Document Reviewed: 05/26/2012 Elsevier Interactive Patient Education 2016 [employer] General Headache Without Cause A headache is pain or discomfort felt around the head or neck area. The specific cause of a headache may not be found. There are many causes and types of headaches. A few common ones are:  Tension headaches.  Migraine headaches.  Cluster headaches.  Chronic daily headaches. HOME CARE INSTRUCTIONS  Watch your condition for any changes. Take these steps to help with your condition: Managing Pain  Take over-the-counter and prescription medicines only as told by your health care provider.  Lie down in a dark, quiet room when you have a headache.  If directed, apply ice to the head and neck area:  Put ice in a plastic bag.  Place a towel between your skin and the bag.  Leave the ice on for 20 minutes, 2-3 times per day.  Use a heating pad or hot shower to apply heat to the head and neck area as told by your health care provider.  Keep lights dim if bright lights bother you or make your headaches worse. Eating and Drinking  Eat meals on a regular schedule.  Limit alcohol use.  Decrease the amount of caffeine you drink, or stop drinking caffeine. General Instructions  Keep all follow-up visits as told by your health care provider. This is important.  Keep a headache journal to help find out what may trigger your headaches. For example, write down:  What you eat and drink.  How much sleep you get.  Any change to your diet or medicines.  Try massage or other relaxation techniques.  Limit stress.  Sit up straight, and do not tense your muscles.  Do not use tobacco products, including cigarettes, chewing tobacco, or e-cigarettes. If you need help quitting, ask your health care provider.  Exercise regularly as told by your health care provider.  Sleep on a regular schedule. Get 7-9 hours of sleep, or the amount recommended by your  health care provider. SEEK MEDICAL CARE IF:   Your symptoms are not helped by medicine.  You have a headache that is different from the usual headache.  You have nausea or you vomit.  You have a fever. SEEK IMMEDIATE MEDICAL CARE IF:   Your headache becomes severe.  You have repeated vomiting.  You have a stiff neck.  You have a loss of vision.  You have problems with speech.  You have pain in the eye or ear.  You have muscular weakness or  loss of muscle control.  You lose your balance or have trouble walking.  You feel faint or pass out.  You have confusion.   This information is not intended to replace advice given to you by your health care provider. Make sure you discuss any questions you have with your health care provider.   Document Released: 03/25/2005 Document Revised: 12/14/2014 Document Reviewed: 07/18/2014 [employer] Interactive Patient Education [employer]

## 2015-08-31 NOTE — MAU Provider Note (Signed)
History     CSN: [id number]  Arrival date and time: 08/31/15 0541   First Provider Initiated Contact with Patient 08/31/15 [phone]      Chief Complaint  Patient presents with  . Headache  . Back Pain   HPI [name] is a 30 y.o. G2P1001 at [redacted]w[redacted]d with twins who presents to MAU today with multiple complaints. The patient states headache off and on since Monday. She has tried Tylenol with minimal relief. She rates pain at 4/10 now, but states that it can sometimes worsen with sharp pains. She has been checking her BP at home because of the headache. She feels the headaches are often worse with lower readings. She had 2 mildly elevated readings at home on Monday night and the rest have been normal, including in the office on Tuesday. She also states significant back and neck pain. She has a small child at home that she cares for during the day. She also complains of increased urinary frequency. She denies dysuria. She has been drinking a lot of water because she often feels dry mouth. She has had some vaginal swelling, but denies vaginal bleeding, discharge, itching or LOF. She reports only occasional, mild, irregular contractions recently. She was not dilated in the office on Tuesday. She reports good fetal movements.   OB History    Gravida Para Term Preterm AB TAB SAB Ectopic Multiple Living   Past Medical History  Diagnosis Date  . GERD (gastroesophageal reflux disease)   . Anxiety   . Vaginal Pap smear, abnormal     bx was normal  . Carpal tunnel syndrome   . Hx of varicella   . Depression     PP anxiety and depression no meds    Past Surgical History  Procedure Laterality Date  . Tonsillectomy    . Adenoidectomy and myringotomy with tube placement      Family History  Problem Relation Age of Onset  . Cancer Maternal Grandmother     lung, smoker  . Diabetes Paternal Grandfather   . Hypertension Paternal Grandfather   . Cancer Paternal  Grandfather   . Hearing loss Neg Hx   . Alcohol abuse Maternal Grandfather   . Rheum arthritis Paternal Grandmother     Social History  Substance Use Topics  . Smoking status: Never Smoker   . Smokeless tobacco: Never Used  . Alcohol Use: Yes     Comment: occ, none with preg    Allergies:  Allergies  Allergen Reactions  . Fentanyl-Bupivacaine-Nacl     Pt's blood pressure dropped when on this medication    Prescriptions prior to admission  Medication Sig Dispense Refill Last Dose  . acetaminophen (TYLENOL) 500 MG tablet Take 500 mg by mouth every 6 (six) hours as needed for moderate pain.    08/30/2015 at Unknown time  . Prenatal Vit-Fe Fumarate-FA (PRENATAL MULTIVITAMIN) TABS tablet Take 1 tablet by mouth daily at 12 noon.    08/30/2015 at Unknown time  . ranitidine (ZANTAC) 75 MG tablet Take 75-150 mg by mouth 2 (two) times daily as needed for heartburn.   08/30/2015 at 8    Review of Systems  Constitutional: Negative for fever and malaise/fatigue.  Eyes: Negative for blurred vision.  Gastrointestinal: Negative for abdominal pain.  Genitourinary: Positive for frequency. Negative for dysuria, urgency and flank pain.       Neg - vaginal bleeding,  discharge, LOF  Musculoskeletal: Positive for back pain.  Neurological: Positive for headaches.   Physical Exam   Blood pressure 114/74, pulse 118, temperature 98.4 F (36.9 C), resp. rate 18, unknown if currently breastfeeding.   Patient Vitals for the past 24 hrs:  BP Temp Pulse Resp  08/31/15 0915 (!) 109/50 mmHg - 87 16  08/31/15 0837 104/63 mmHg - 95 16  08/31/15 0737 126/82 mmHg - 103 -  08/31/15 0700 114/74 mmHg - 118 -  08/31/15 0645 118/73 mmHg 98.4 F (36.9 C) 110 18  08/31/15 0630 121/80 mmHg - 108 -  08/31/15 0615 134/81 mmHg - 111 -  08/31/15 0612 129/79 mmHg - (!) 126 -    Physical Exam  Nursing note and vitals reviewed. Constitutional: She is oriented to person, place, and time. She appears well-developed  and well-nourished. No distress.  HENT:  Head: Normocephalic and atraumatic.  Cardiovascular: Normal rate.   Respiratory: Effort normal.  GI: Soft. She exhibits no distension and no mass. There is no tenderness. There is no rebound and no guarding.  Neurological: She is alert and oriented to person, place, and time.  Skin: Skin is warm and dry. No erythema.  Psychiatric: She has a normal mood and affect.    Results for orders placed or performed during the hospital encounter of 08/31/15 (from the past 24 hour(s))  Urinalysis, Routine w reflex microscopic (not at [facility])     Status: Abnormal   Collection Time: 08/31/15  5:49 AM  Result Value Ref Range   Color, Urine YELLOW YELLOW   APPearance CLEAR CLEAR   Specific Gravity, Urine 1.015 1.005 - 1.030   pH 6.5 5.0 - 8.0   Glucose, UA NEGATIVE NEGATIVE mg/dL   Hgb urine dipstick NEGATIVE NEGATIVE   Bilirubin Urine NEGATIVE NEGATIVE   Ketones, ur 40 (A) NEGATIVE mg/dL   Protein, ur NEGATIVE NEGATIVE mg/dL   Nitrite NEGATIVE NEGATIVE   Leukocytes, UA TRACE (A) NEGATIVE  Urine microscopic-add on     Status: Abnormal   Collection Time: 08/31/15  5:49 AM  Result Value Ref Range   Squamous Epithelial / LPF 6-30 (A) NONE SEEN   WBC, UA 0-5 0 - 5 WBC/hpf   RBC / HPF 0-5 0 - 5 RBC/hpf   Bacteria, UA RARE (A) NONE SEEN  CBC     Status: Abnormal   Collection Time: 08/31/15  8:48 AM  Result Value Ref Range   WBC 10.6 (H) 4.0 - 10.5 K/uL   RBC 3.62 (L) 3.87 - 5.11 MIL/uL   Hemoglobin 9.4 (L) 12.0 - 15.0 g/dL   HCT 96.0 (L) 45.4 - 09.8 %   MCV 83.1 78.0 - 100.0 fL   MCH 26.0 26.0 - 34.0 pg   MCHC 31.2 30.0 - 36.0 g/dL   RDW 11.9 14.7 - 82.9 %   Platelets 144 (L) 150 - 400 K/uL  Comprehensive metabolic panel     Status: Abnormal   Collection Time: 08/31/15  8:48 AM  Result Value Ref Range   Sodium 137 135 - 145 mmol/L   Potassium 3.5 3.5 - 5.1 mmol/L   Chloride 108 101 - 111 mmol/L   CO2 22 22 - 32 mmol/L   Glucose, Bld 86 65 - 99  mg/dL   BUN 7 6 - 20 mg/dL   Creatinine, Ser 5.62 0.44 - 1.00 mg/dL   Calcium 8.3 (L) 8.9 - 10.3 mg/dL   Total Protein 5.7 (L) 6.5 - 8.1 g/dL   Albumin 2.7 (L)  3.5 - 5.0 g/dL   AST 19 15 - 41 U/L   ALT 14 14 - 54 U/L   Alkaline Phosphatase 154 (H) 38 - 126 U/L   Total Bilirubin 0.6 0.3 - 1.2 mg/dL   GFR calc non Af Amer >60 >60 mL/min   GFR calc Af Amer >60 >60 mL/min   Anion gap 7 5 - 15   Fetal Monitoring: Baseline A: 140 bpm Variability: moderate Accelerations: 15 x 15 Decelerations: none Baseline B: 130 bpm Variability: moderate Accelerations: 15 x 15 Decelerations: none Contractions: mild UI with occasional contractions  MAU Course  Procedures None  MDM UA today  Flexeril 10 mg given for headache as most likely tension headache due to back and neck pain. Patient states no improvement with Flexeril Discussed patient with Dr. Rana SnareLowe. He recommends CBC, CMP, IV LR bolus and 1 mg Dilaudid for pain.  Orders entered as recommended.  0800 - patient receiving IV fluids, labs pending. Care turned over to Venia CarbonJennifer Rasch, NP  Marny LowensteinJulie N Wenzel, PA-C  08/31/2015, 7:53 AM   0945: Dr. Rana SnareLowe called-no answer. Patient reports improvement in pain. Discussed CBC and CMP results with the patient.  1005: Left message with Dr. Rana SnareLowe to return call to MAU.  1015: Dr. Rana SnareLowe returned call to MAU, Labs, vitals, and HPI discussed. Ok to discharge home.  Assessment and Plan   A:  1. Headache in pregnancy, antepartum, third trimester     P:  Discharge home in stable condition Return to MAU if symptoms worsen Follow up with Dr. Rana SnareLowe as scheduled Ok to use tylenol for HA at home, if needed. As directed on the bottle  Duane LopeJennifer I Rasch, NP 08/31/2015 2:16 PM

## 2015-09-04 ENCOUNTER — Encounter (HOSPITAL_COMMUNITY): Payer: Self-pay

## 2015-09-04 ENCOUNTER — Inpatient Hospital Stay (EMERGENCY_DEPARTMENT_HOSPITAL)
Admission: AD | Admit: 2015-09-04 | Discharge: 2015-09-04 | Disposition: A | Discharge status: Home / Self Care | Payer: BC Managed Care – PPO | Source: Ambulatory Visit | Attending: Obstetrics and Gynecology | Admitting: Obstetrics and Gynecology

## 2015-09-04 DIAGNOSIS — IMO0001 Reserved for inherently not codable concepts without codable children: Secondary | ICD-10-CM

## 2015-09-04 DIAGNOSIS — R51 Headache: Secondary | ICD-10-CM

## 2015-09-04 DIAGNOSIS — O368130 Decreased fetal movements, third trimester, not applicable or unspecified: Secondary | ICD-10-CM | POA: Diagnosis not present

## 2015-09-04 DIAGNOSIS — O9989 Other specified diseases and conditions complicating pregnancy, childbirth and the puerperium: Secondary | ICD-10-CM

## 2015-09-04 DIAGNOSIS — R519 Headache, unspecified: Secondary | ICD-10-CM

## 2015-09-04 DIAGNOSIS — O133 Gestational [pregnancy-induced] hypertension without significant proteinuria, third trimester: Secondary | ICD-10-CM | POA: Diagnosis not present

## 2015-09-04 DIAGNOSIS — O1494 Unspecified pre-eclampsia, complicating childbirth: Secondary | ICD-10-CM | POA: Diagnosis not present

## 2015-09-04 DIAGNOSIS — O30043 Twin pregnancy, dichorionic/diamniotic, third trimester: Secondary | ICD-10-CM | POA: Diagnosis not present

## 2015-09-04 LAB — URINALYSIS, ROUTINE W REFLEX MICROSCOPIC
BILIRUBIN URINE: NEGATIVE
Glucose, UA: NEGATIVE mg/dL
HGB URINE DIPSTICK: NEGATIVE
KETONES UR: NEGATIVE mg/dL
Leukocytes, UA: NEGATIVE
Nitrite: NEGATIVE
PROTEIN: NEGATIVE mg/dL
Specific Gravity, Urine: <1.005 — ABNORMAL LOW (ref 1.005–1.030)
pH: 6 (ref 5.0–8.0)

## 2015-09-04 LAB — COMPREHENSIVE METABOLIC PANEL
ALBUMIN: 3 g/dL — AB (ref 3.5–5.0)
ALT: 16 U/L (ref 14–54)
AST: 23 U/L (ref 15–41)
Alkaline Phosphatase: 187 U/L — ABNORMAL HIGH (ref 38–126)
Anion gap: 7 (ref 5–15)
BUN: 8 mg/dL (ref 6–20)
CHLORIDE: 109 mmol/L (ref 101–111)
CO2: 21 mmol/L — ABNORMAL LOW (ref 22–32)
Calcium: 8.9 mg/dL (ref 8.9–10.3)
Creatinine, Ser: 0.6 mg/dL (ref 0.44–1.00)
GFR calc Af Amer: >60 mL/min (ref >60)
Glucose, Bld: 100 mg/dL — ABNORMAL HIGH (ref 65–99)
POTASSIUM: 3.6 mmol/L (ref 3.5–5.1)
Sodium: 137 mmol/L (ref 135–145)
Total Bilirubin: 0.3 mg/dL (ref 0.3–1.2)
Total Protein: 5.8 g/dL — ABNORMAL LOW (ref 6.5–8.1)

## 2015-09-04 LAB — PROTEIN / CREATININE RATIO, URINE
CREATININE, URINE: 49 mg/dL
Total Protein, Urine: <6 mg/dL

## 2015-09-04 LAB — CBC
HCT: 32.8 % — ABNORMAL LOW (ref 36.0–46.0)
Hemoglobin: 10.6 g/dL — ABNORMAL LOW (ref 12.0–15.0)
MCH: 26.6 pg (ref 26.0–34.0)
MCHC: 32.3 g/dL (ref 30.0–36.0)
MCV: 82.4 fL (ref 78.0–100.0)
PLATELETS: 193 10*3/uL (ref 150–400)
RBC: 3.98 MIL/uL (ref 3.87–5.11)
RDW: 15.4 % (ref 11.5–15.5)
WBC: 10.3 10*3/uL (ref 4.0–10.5)

## 2015-09-04 MED ORDER — DEXAMETHASONE SODIUM PHOSPHATE 10 MG/ML IJ SOLN
10.0000 mg | Freq: Once | INTRAMUSCULAR | Status: AC
  Administered 2015-09-04: 10 mg via INTRAVENOUS
  Filled 2015-09-04: qty 1

## 2015-09-04 MED ORDER — METOCLOPRAMIDE HCL 5 MG/ML IJ SOLN
10.0000 mg | Freq: Once | INTRAMUSCULAR | Status: AC
  Administered 2015-09-04: 10 mg via INTRAVENOUS
  Filled 2015-09-04: qty 2

## 2015-09-04 MED ORDER — SODIUM CHLORIDE 0.9 % IV SOLN
INTRAVENOUS | Status: DC
  Administered 2015-09-04: 02:00:00 via INTRAVENOUS

## 2015-09-04 MED ORDER — DIPHENHYDRAMINE HCL 50 MG/ML IJ SOLN
25.0000 mg | Freq: Once | INTRAMUSCULAR | Status: AC
  Administered 2015-09-04: 25 mg via INTRAVENOUS
  Filled 2015-09-04: qty 1

## 2015-09-04 NOTE — MAU Note (Signed)
Pt c/o fever, HA, swelling. Symptoms started Monday but have gotten worse tonight. Irregular contractions. Denies LOF or vag bleeding. +FM

## 2015-09-04 NOTE — Discharge Instructions (Signed)
Braxton Hicks Contractions °Contractions of the uterus can occur throughout pregnancy. Contractions are not always a sign that you are in labor.  °WHAT ARE BRAXTON HICKS CONTRACTIONS?  °Contractions that occur before labor are called Braxton Hicks contractions, or false labor. Toward the end of pregnancy (32-34 weeks), these contractions can develop more often and may become more forceful. This is not true labor because these contractions do not result in opening (dilatation) and thinning of the cervix. They are sometimes difficult to tell apart from true labor because these contractions can be forceful and people have different pain tolerances. You should not feel embarrassed if you go to the hospital with false labor. Sometimes, the only way to tell if you are in true labor is for your health care provider to look for changes in the cervix. °If there are no prenatal problems or other health problems associated with the pregnancy, it is completely safe to be sent home with false labor and await the onset of true labor. °HOW CAN YOU TELL THE DIFFERENCE BETWEEN TRUE AND FALSE LABOR? °False Labor °· The contractions of false labor are usually shorter and not as hard as those of true labor.   °· The contractions are usually irregular.   °· The contractions are often felt in the front of the lower abdomen and in the groin.   °· The contractions may go away when you walk around or change positions while lying down.   °· The contractions get weaker and are shorter lasting as time goes on.   °· The contractions do not usually become progressively stronger, regular, and closer together as with true labor.   °True Labor °· Contractions in true labor last 30-70 seconds, become very regular, usually become more intense, and increase in frequency.   °· The contractions do not go away with walking.   °· The discomfort is usually felt in the top of the uterus and spreads to the lower abdomen and low back.   °· True labor can be  determined by your health care provider with an exam. This will show that the cervix is dilating and getting thinner.   °WHAT TO REMEMBER °· Keep up with your usual exercises and follow other instructions given by your health care provider.   °· Take medicines as directed by your health care provider.   °· Keep your regular prenatal appointments.   °· Eat and drink lightly if you think you are going into labor.   °· If Braxton Hicks contractions are making you uncomfortable:   °¨ Change your position from lying down or resting to walking, or from walking to resting.   °¨ Sit and rest in a tub of warm water.   °¨ Drink 2-3 glasses of water. Dehydration may cause these contractions.   °¨ Do slow and deep breathing several times an hour.   °WHEN SHOULD I SEEK IMMEDIATE MEDICAL CARE? °Seek immediate medical care if: °· Your contractions become stronger, more regular, and closer together.   °· You have fluid leaking or gushing from your vagina.   °· You have a fever.   °· You pass blood-tinged mucus.   °· You have vaginal bleeding.   °· You have continuous abdominal pain.   °· You have low back pain that you never had before.   °· You feel your baby's head pushing down and causing pelvic pressure.   °· Your baby is not moving as much as it used to.   °  °This information is not intended to replace advice given to you by your health care provider. Make sure you discuss any questions you have with your health care   provider. °  °Document Released: 03/25/2005 Document Revised: 03/30/2013 Document Reviewed: 01/04/2013 °Elsevier Interactive Patient Education ©2016 [employer] °Preeclampsia and Eclampsia °Preeclampsia is a serious condition that develops only during pregnancy. It is also called toxemia of pregnancy. This condition causes high blood pressure along with other symptoms, such as swelling and headaches. These may develop as the condition gets worse. Preeclampsia may occur 20 weeks or later into your pregnancy.    °Diagnosing and treating preeclampsia early is very important. If not treated early, it can cause serious problems for you and your baby. One problem it can lead to is eclampsia, which is a condition that causes muscle jerking or shaking (convulsions) in the mother. Delivering your baby is the best treatment for preeclampsia or eclampsia.  °RISK FACTORS °The cause of preeclampsia is not known. You may be more likely to develop preeclampsia if you have certain risk factors. These include:  °· Being pregnant for the first time. °· Having preeclampsia in a past pregnancy. °· Having a family history of preeclampsia. °· Having high blood pressure. °· Being pregnant with twins or triplets. °· Being 35 or older. °· Being African American. °· Having kidney disease or diabetes. °· Having medical conditions such as lupus or blood diseases. °· Being very overweight (obese). °SIGNS AND SYMPTOMS  °The earliest signs of preeclampsia are: °· High blood pressure. °· Increased protein in your urine. Your health care provider will check for this at every prenatal visit. °Other symptoms that can develop include:  °· Severe headaches. °· Sudden weight gain. °· Swelling of your hands, face, legs, and feet. °· Feeling sick to your stomach (nauseous) and throwing up (vomiting). °· Vision problems (blurred or double vision). °· Numbness in your face, arms, legs, and feet. °· Dizziness. °· Slurred speech. °· Sensitivity to bright lights. °· Abdominal pain. °DIAGNOSIS  °There are no screening tests for preeclampsia. Your health care provider will ask you about symptoms and check for signs of preeclampsia during your prenatal visits. You may also have tests, including: °· Urine testing. °· Blood testing. °· Checking your baby's heart rate. °· Checking the health of your baby and your placenta using images created with sound waves (ultrasound). °TREATMENT  °You can work out the best treatment approach together with your health care provider.  It is very important to keep all prenatal appointments. If you have an increased risk of preeclampsia, you may need more frequent prenatal exams. °· Your health care provider may prescribe bed rest. °· You may have to eat as little salt as possible. °· You may need to take medicine to lower your blood pressure if the condition does not respond to more conservative measures. °· You may need to stay in the hospital if your condition is severe. There, treatment will focus on controlling your blood pressure and fluid retention. You may also need to take medicine to prevent seizures. °· If the condition gets worse, your baby may need to be delivered early to protect you and the baby. You may have your labor started with medicine (be induced), or you may have a cesarean delivery. °· Preeclampsia usually goes away after the baby is born. °HOME CARE INSTRUCTIONS  °· Only take over-the-counter or prescription medicines as directed by your health care provider. °· Lie on your left side while resting. This keeps pressure off your baby. °· Elevate your feet while resting. °· Get regular exercise. Ask your health care provider what type of exercise is safe for you. °·   Avoid caffeine and alcohol. °· Do not smoke. °· Drink 6-8 glasses of water every day. °· Eat a balanced diet that is low in salt. Do not add salt to your food. °· Avoid stressful situations as much as possible. °· Get plenty of rest and sleep. °· Keep all prenatal appointments and tests as scheduled. °SEEK MEDICAL CARE IF: °· You are gaining more weight than expected. °· You have any headaches, abdominal pain, or nausea. °· You are bruising more than usual. °· You feel dizzy or light-headed. °SEEK IMMEDIATE MEDICAL CARE IF:  °· You develop sudden or severe swelling anywhere in your body. This usually happens in the legs. °· You gain 5 lb (2.3 kg) or more in a week. °· You have a severe headache, dizziness, problems with your vision, or confusion. °· You have severe  abdominal pain. °· You have lasting nausea or vomiting. °· You have a seizure. °· You have trouble moving any part of your body. °· You develop numbness in your body. °· You have trouble speaking. °· You have any abnormal bleeding. °· You develop a stiff neck. °· You pass out. °MAKE SURE YOU:  °· Understand these instructions. °· Will watch your condition. °· Will get help right away if you are not doing well or get worse. °  °This information is not intended to replace advice given to you by your health care provider. Make sure you discuss any questions you have with your health care provider. °  °Document Released: 03/22/2000 Document Revised: 03/30/2013 Document Reviewed: 01/15/2013 °Elsevier Interactive Patient Education ©2016 [employer]. ° °

## 2015-09-04 NOTE — MAU Provider Note (Signed)
History     CSN: [id number]  Arrival date and time: 09/04/15 3086   First Provider Initiated Contact with Patient 09/04/15 0115      Chief Complaint  Patient presents with  . Headache   HPI [name] is a 30 y.o. G2P1001 at [redacted]w[redacted]d with twin gestation who presents to MAU today with multiple complaints. The patient states that she has felt swollen in her vaginal area for > 1 week. She is concerned about possible yeast, although denies itching, discharge or irritation. She also denies vaginal bleeding or LOF. She states tonight she noted fever of 101.6 F at home. She has been taking Tylenol for pain and fever has resolved upon arrival in MAU. She also states headache has continued off and on since last week. She rates pain at 4/10 now. She states that she has sharp pains that come and go as well. She denies blurred vision, LE edema or RUQ abdominal pain. She does endorse occasional floaters, mild swelling of both hands and irregular contractions. She is feeling weak and tired and off-balance frequently.   OB History    Gravida Para Term Preterm AB TAB SAB Ectopic Multiple Living   Past Medical History  Diagnosis Date  . GERD (gastroesophageal reflux disease)   . Anxiety   . Vaginal Pap smear, abnormal     bx was normal  . Carpal tunnel syndrome   . Hx of varicella   . Depression     PP anxiety and depression no meds    Past Surgical History  Procedure Laterality Date  . Tonsillectomy    . Adenoidectomy and myringotomy with tube placement      Family History  Problem Relation Age of Onset  . Cancer Maternal Grandmother     lung, smoker  . Diabetes Paternal Grandfather   . Hypertension Paternal Grandfather   . Cancer Paternal Grandfather   . Hearing loss Neg Hx   . Alcohol abuse Maternal Grandfather   . Rheum arthritis Paternal Grandmother     Social History  Substance Use Topics  . Smoking status: Never Smoker   . Smokeless tobacco: Never  Used  . Alcohol Use: Yes     Comment: occ, none with preg    Allergies:  Allergies  Allergen Reactions  . Fentanyl-Bupivacaine-Nacl     Pt's blood pressure dropped when on this medication    Prescriptions prior to admission  Medication Sig Dispense Refill Last Dose  . acetaminophen (TYLENOL) 500 MG tablet Take 500 mg by mouth every 6 (six) hours as needed for moderate pain.    08/30/2015 at Unknown time  . Prenatal Vit-Fe Fumarate-FA (PRENATAL MULTIVITAMIN) TABS tablet Take 1 tablet by mouth daily at 12 noon.    08/30/2015 at Unknown time  . ranitidine (ZANTAC) 75 MG tablet Take 75-150 mg by mouth 2 (two) times daily as needed for heartburn.   08/30/2015 at 8    Review of Systems  Constitutional: Negative for fever and malaise/fatigue.  Eyes: Negative for blurred vision.       + floaters  Gastrointestinal: Positive for nausea. Negative for vomiting, abdominal pain, diarrhea and constipation.  Genitourinary: Negative for dysuria, urgency and frequency.       Neg - vaginal bleeding, discharge, LOF  Neurological: Positive for headaches.   Physical Exam   Blood pressure 148/81, pulse 118, temperature 98.1 F (36.7 C), temperature source Oral, resp.  rate 20, height 5\' 4"  (1.626 m), weight 251 lb (113.853 kg), SpO2 98 %, unknown if currently breastfeeding.  Physical Exam  Nursing note and vitals reviewed. Constitutional: She is oriented to person, place, and time. She appears well-developed and well-nourished. No distress.  HENT:  Head: Normocephalic and atraumatic.  Cardiovascular: Normal rate.   Respiratory: Effort normal.  GI: Soft. She exhibits no distension and no mass. There is no tenderness. There is no rebound and no guarding.  Musculoskeletal: She exhibits no edema.  Neurological: She is alert and oriented to person, place, and time. She has normal reflexes.  No clonus  Skin: Skin is warm and dry. No erythema.  Psychiatric: She has a normal mood and affect.   Results  for orders placed or performed during the hospital encounter of 09/04/15 (from the past 24 hour(s))  Urinalysis, Routine w reflex microscopic (not at [facility])     Status: Abnormal   Collection Time: 09/04/15 12:46 AM  Result Value Ref Range   Color, Urine YELLOW YELLOW   APPearance CLEAR CLEAR   Specific Gravity, Urine <1.005 (L) 1.005 - 1.030   pH 6.0 5.0 - 8.0   Glucose, UA NEGATIVE NEGATIVE mg/dL   Hgb urine dipstick NEGATIVE NEGATIVE   Bilirubin Urine NEGATIVE NEGATIVE   Ketones, ur NEGATIVE NEGATIVE mg/dL   Protein, ur NEGATIVE NEGATIVE mg/dL   Nitrite NEGATIVE NEGATIVE   Leukocytes, UA NEGATIVE NEGATIVE  Protein / creatinine ratio, urine     Status: None   Collection Time: 09/04/15  1:12 AM  Result Value Ref Range   Creatinine, Urine 49.00 mg/dL   Total Protein, Urine <6 mg/dL   Protein Creatinine Ratio        0.00 - 0.15 mg/mg[Cre]  CBC     Status: Abnormal   Collection Time: 09/04/15  1:16 AM  Result Value Ref Range   WBC 10.3 4.0 - 10.5 K/uL   RBC 3.98 3.87 - 5.11 MIL/uL   Hemoglobin 10.6 (L) 12.0 - 15.0 g/dL   HCT 95.632.8 (L) 21.336.0 - 08.646.0 %   MCV 82.4 78.0 - 100.0 fL   MCH 26.6 26.0 - 34.0 pg   MCHC 32.3 30.0 - 36.0 g/dL   RDW 57.815.4 46.911.5 - 62.915.5 %   Platelets 193 150 - 400 K/uL  Comprehensive metabolic panel     Status: Abnormal   Collection Time: 09/04/15  1:16 AM  Result Value Ref Range   Sodium 137 135 - 145 mmol/L   Potassium 3.6 3.5 - 5.1 mmol/L   Chloride 109 101 - 111 mmol/L   CO2 21 (L) 22 - 32 mmol/L   Glucose, Bld 100 (H) 65 - 99 mg/dL   BUN 8 6 - 20 mg/dL   Creatinine, Ser 5.280.60 0.44 - 1.00 mg/dL   Calcium 8.9 8.9 - 41.310.3 mg/dL   Total Protein 5.8 (L) 6.5 - 8.1 g/dL   Albumin 3.0 (L) 3.5 - 5.0 g/dL   AST 23 15 - 41 U/L   ALT 16 14 - 54 U/L   Alkaline Phosphatase 187 (H) 38 - 126 U/L   Total Bilirubin 0.3 0.3 - 1.2 mg/dL   GFR calc non Af Amer >60 >60 mL/min   GFR calc Af Amer >60 >60 mL/min   Anion gap 7 5 - 15    Fetal Monitoring: Baseline A: 145  bpm Variability: moderate Accelerations: 15 x15 Decelerations: none Baseline B: 130 bpm Variability: moderate Accelerations: 15 x 15 Decelerations: none Contractions: few, irregular  Patient Vitals  for the past 24 hrs:  BP Temp Temp src Pulse Resp SpO2 Height Weight  09/04/15 0223 148/81 mmHg - - 118 - 98 % - -  09/04/15 0217 - - - 109 - 97 % - -  09/04/15 0212 - - - 111 - 98 % - -  09/04/15 0207 - - - 109 - 100 % - -  09/04/15 0202 - - - 113 - 100 % - -  09/04/15 0157 - - - 113 - 99 % - -  09/04/15 0149 - - - 116 - 100 % - -  09/04/15 0145 117/77 mmHg - - 112 - 99 % - -  09/04/15 0144 - - - 101 - - - -  09/04/15 0139 - - - 105 - 100 % - -  09/04/15 0134 - - - 116 - 99 % - -  09/04/15 0130 121/82 mmHg - - 106 - - - -  09/04/15 0129 - - - 107 - 100 % - -  09/04/15 0115 125/87 mmHg - - (!) 121 - - - -  09/04/15 0107 128/74 mmHg - - 107 - - - -  09/04/15 0041 (!) 137/105 mmHg 98.1 F (36.7 C) Oral 110 20 100 % 5\' 4"  (1.626 m) 251 lb (113.853 kg)     MAU Course  Procedures  MDM UA, CBC, CMP, Urine protein/creatinine ratio today  Serial BPs IV NS bolus with 10 mg Decadron, 10 mg Reglan and 25 mg Benadryl given for headache Discussed patient with Dr. Marcelle Overlie. Recommends discharge at this time with instructions for rest and follow-up in the office as scheduled.  Assessment and Plan  A: Twin IUP at [redacted]w[redacted]d Headache Weakness and fatigue  P: Discharge home Continue Tylenol PRN for pain Increased rest and relaxation techniques discussed Warning signs for pre-eclampsia and labor precautions discussed Patient advised to follow-up with [facility] as scheduled or sooner PRN Patient may return to MAU as needed or if her condition were to change or worsen   Marny Lowenstein, PA-C  09/04/2015, 2:39 AM

## 2015-09-05 ENCOUNTER — Observation Stay (HOSPITAL_COMMUNITY): Payer: BC Managed Care – PPO

## 2015-09-05 ENCOUNTER — Inpatient Hospital Stay (HOSPITAL_COMMUNITY)
Admission: AD | Admit: 2015-09-05 | Discharge: 2015-09-08 | DRG: 765 | Disposition: A | Discharge status: Home / Self Care | Payer: BC Managed Care – PPO | Source: Ambulatory Visit | Attending: Obstetrics & Gynecology | Admitting: Obstetrics & Gynecology

## 2015-09-05 ENCOUNTER — Encounter (HOSPITAL_COMMUNITY): Payer: Self-pay

## 2015-09-05 DIAGNOSIS — Z3A37 37 weeks gestation of pregnancy: Secondary | ICD-10-CM | POA: Diagnosis not present

## 2015-09-05 DIAGNOSIS — R509 Fever, unspecified: Secondary | ICD-10-CM | POA: Diagnosis present

## 2015-09-05 DIAGNOSIS — Z888 Allergy status to other drugs, medicaments and biological substances status: Secondary | ICD-10-CM

## 2015-09-05 DIAGNOSIS — Z79899 Other long term (current) drug therapy: Secondary | ICD-10-CM | POA: Diagnosis not present

## 2015-09-05 DIAGNOSIS — O368130 Decreased fetal movements, third trimester, not applicable or unspecified: Secondary | ICD-10-CM

## 2015-09-05 DIAGNOSIS — R51 Headache: Secondary | ICD-10-CM

## 2015-09-05 DIAGNOSIS — O9989 Other specified diseases and conditions complicating pregnancy, childbirth and the puerperium: Secondary | ICD-10-CM | POA: Diagnosis not present

## 2015-09-05 DIAGNOSIS — O1494 Unspecified pre-eclampsia, complicating childbirth: Principal | ICD-10-CM | POA: Diagnosis present

## 2015-09-05 DIAGNOSIS — O99824 Streptococcus B carrier state complicating childbirth: Secondary | ICD-10-CM | POA: Diagnosis present

## 2015-09-05 DIAGNOSIS — O1410 Severe pre-eclampsia, unspecified trimester: Secondary | ICD-10-CM | POA: Diagnosis present

## 2015-09-05 DIAGNOSIS — K219 Gastro-esophageal reflux disease without esophagitis: Secondary | ICD-10-CM | POA: Diagnosis present

## 2015-09-05 DIAGNOSIS — Z23 Encounter for immunization: Secondary | ICD-10-CM | POA: Diagnosis not present

## 2015-09-05 DIAGNOSIS — O30043 Twin pregnancy, dichorionic/diamniotic, third trimester: Secondary | ICD-10-CM | POA: Diagnosis present

## 2015-09-05 DIAGNOSIS — Z6841 Body Mass Index (BMI) 40.0 and over, adult: Secondary | ICD-10-CM | POA: Diagnosis not present

## 2015-09-05 DIAGNOSIS — O26893 Other specified pregnancy related conditions, third trimester: Secondary | ICD-10-CM

## 2015-09-05 DIAGNOSIS — F419 Anxiety disorder, unspecified: Secondary | ICD-10-CM | POA: Diagnosis present

## 2015-09-05 DIAGNOSIS — O133 Gestational [pregnancy-induced] hypertension without significant proteinuria, third trimester: Secondary | ICD-10-CM | POA: Diagnosis not present

## 2015-09-05 DIAGNOSIS — R519 Headache, unspecified: Secondary | ICD-10-CM

## 2015-09-05 HISTORY — DX: Other complications of anesthesia, initial encounter: T88.59XA

## 2015-09-05 HISTORY — DX: Adverse effect of unspecified anesthetic, initial encounter: T41.45XA

## 2015-09-05 LAB — CBC WITH DIFFERENTIAL/PLATELET
Basophils Absolute: 0 10*3/uL (ref 0.0–0.1)
Basophils Relative: 0 %
EOS ABS: 0.1 10*3/uL (ref 0.0–0.7)
Eosinophils Relative: 1 %
HEMATOCRIT: 32.7 % — AB (ref 36.0–46.0)
Hemoglobin: 10.4 g/dL — ABNORMAL LOW (ref 12.0–15.0)
LYMPHS PCT: 22 %
Lymphs Abs: 2.1 10*3/uL (ref 0.7–4.0)
MCH: 26.3 pg (ref 26.0–34.0)
MCHC: 31.8 g/dL (ref 30.0–36.0)
MCV: 82.8 fL (ref 78.0–100.0)
MONOS PCT: 6 %
Monocytes Absolute: 0.6 10*3/uL (ref 0.1–1.0)
NEUTROS ABS: 6.8 10*3/uL (ref 1.7–7.7)
Neutrophils Relative %: 71 %
Other: 0 %
PLATELETS: 195 10*3/uL (ref 150–400)
RBC: 3.95 MIL/uL (ref 3.87–5.11)
RDW: 15.8 % — AB (ref 11.5–15.5)
WBC: 9.6 10*3/uL (ref 4.0–10.5)

## 2015-09-05 LAB — URINALYSIS, ROUTINE W REFLEX MICROSCOPIC
Bilirubin Urine: NEGATIVE
Glucose, UA: NEGATIVE mg/dL
HGB URINE DIPSTICK: NEGATIVE
Ketones, ur: NEGATIVE mg/dL
LEUKOCYTES UA: NEGATIVE
Nitrite: NEGATIVE
PROTEIN: NEGATIVE mg/dL
Specific Gravity, Urine: 1.01 (ref 1.005–1.030)
pH: 5.5 (ref 5.0–8.0)

## 2015-09-05 LAB — COMPREHENSIVE METABOLIC PANEL
ALBUMIN: 3 g/dL — AB (ref 3.5–5.0)
ALT: 15 U/L (ref 14–54)
AST: 22 U/L (ref 15–41)
Alkaline Phosphatase: 164 U/L — ABNORMAL HIGH (ref 38–126)
Anion gap: 8 (ref 5–15)
BUN: 6 mg/dL (ref 6–20)
CHLORIDE: 108 mmol/L (ref 101–111)
CO2: 19 mmol/L — ABNORMAL LOW (ref 22–32)
CREATININE: 0.58 mg/dL (ref 0.44–1.00)
Calcium: 8.3 mg/dL — ABNORMAL LOW (ref 8.9–10.3)
GFR calc Af Amer: >60 mL/min (ref >60)
GFR calc non Af Amer: >60 mL/min (ref >60)
GLUCOSE: 95 mg/dL (ref 65–99)
POTASSIUM: 3.5 mmol/L (ref 3.5–5.1)
Sodium: 135 mmol/L (ref 135–145)
Total Bilirubin: 0.4 mg/dL (ref 0.3–1.2)
Total Protein: 6.2 g/dL — ABNORMAL LOW (ref 6.5–8.1)

## 2015-09-05 IMAGING — CT CT HEAD W/O CM
1 of 6 series · 13 of 40 positions shown, 17 images · IV contrast (isovue)
Comparison: None.

CLINICAL DATA: 30-year-old female who is pregnant in the third
trimester with severe headache. Negative for preeclampsia. Patient
reports fever up to 101.5 F. Initial encounter.

EXAM:
CT ANGIOGRAPHY HEAD
TECHNIQUE: Multidetector CT imaging of the head was performed using the
standard protocol during bolus administration of intravenous
contrast. Multiplanar CT image reconstructions and MIPs were
obtained to evaluate the vascular anatomy.
CONTRAST:  100 mL Isovue 370

[Series 7: (person_name) thins · axial · 0.43mm/px · z∈[-103,+49]mm · 13 of 256 slices shown, 17 images]
[im 19/256  brain]
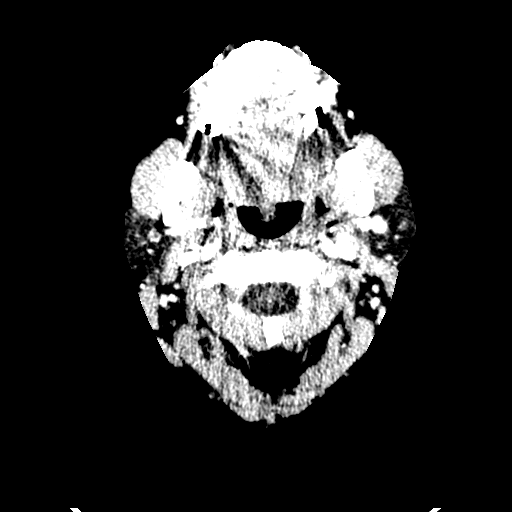
[im 19/256  bone]
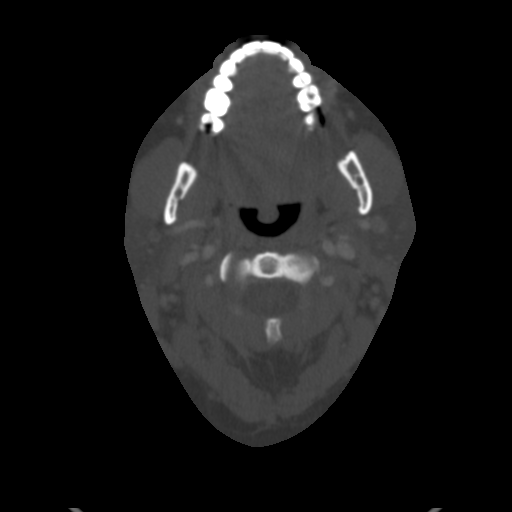
[im 37/256  brain]
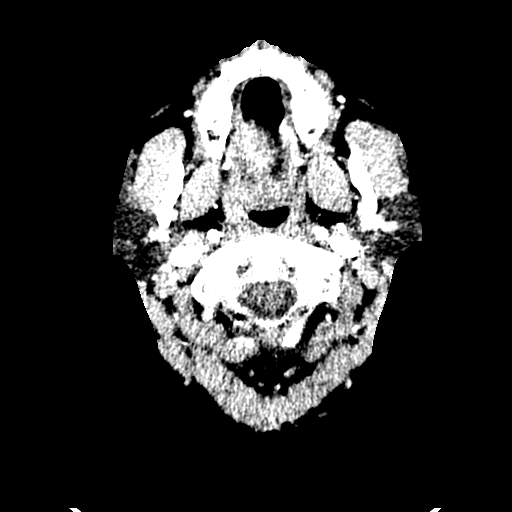
[im 55/256  brain]
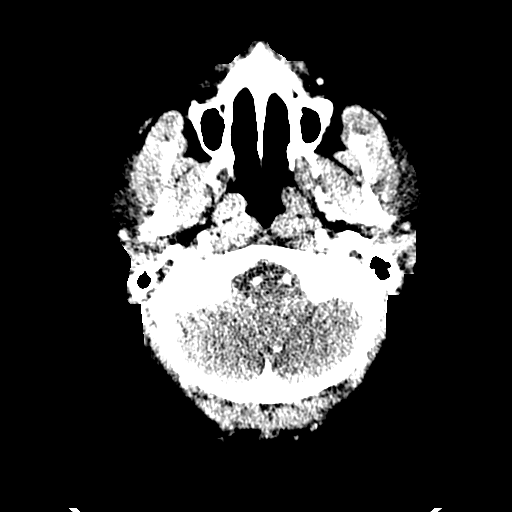
[im 73/256  brain]
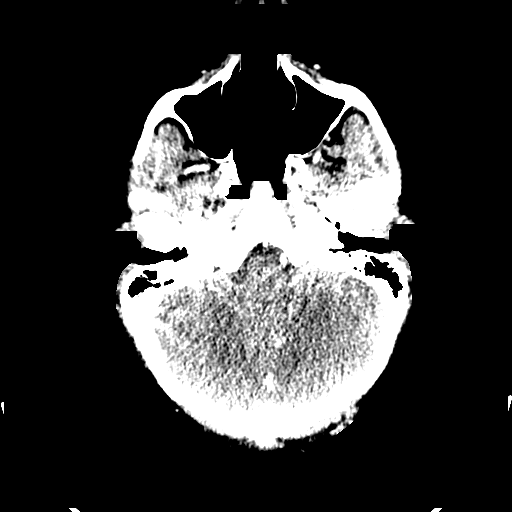
[im 92/256  brain]
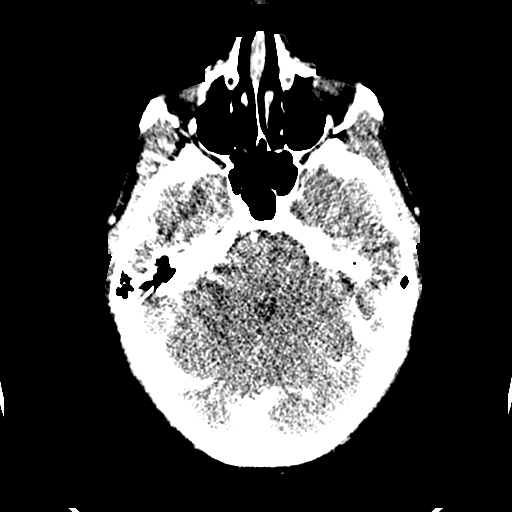
[im 92/256  bone]
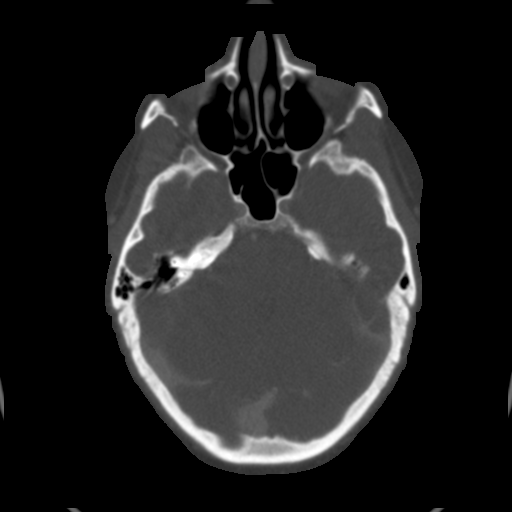
[im 110/256  brain]
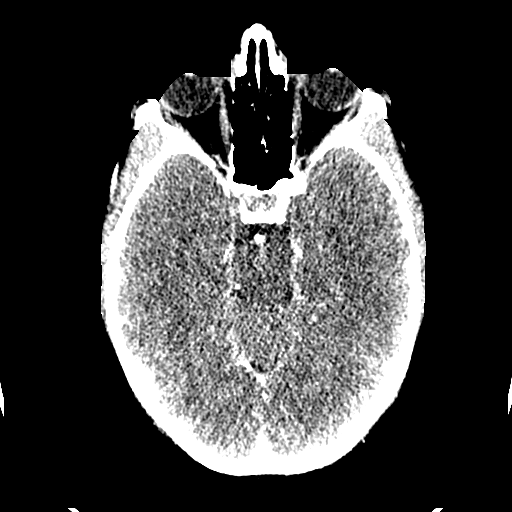
[im 128/256  brain]
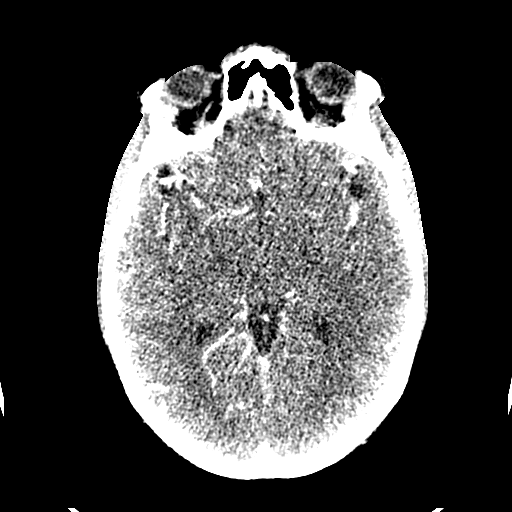
[im 146/256  brain]
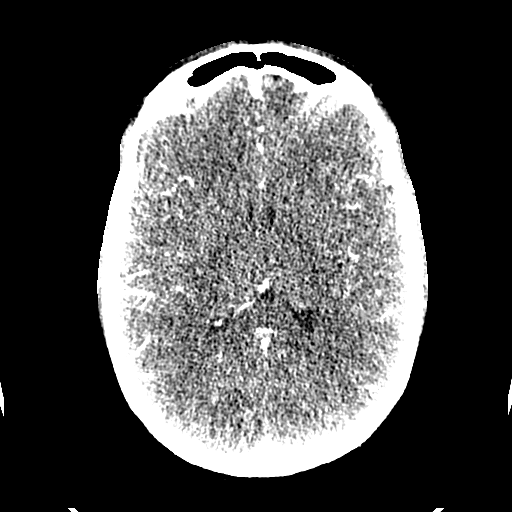
[im 164/256  brain]
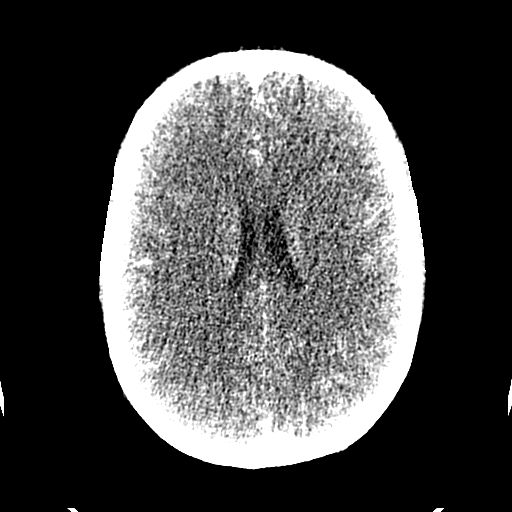
[im 164/256  bone]
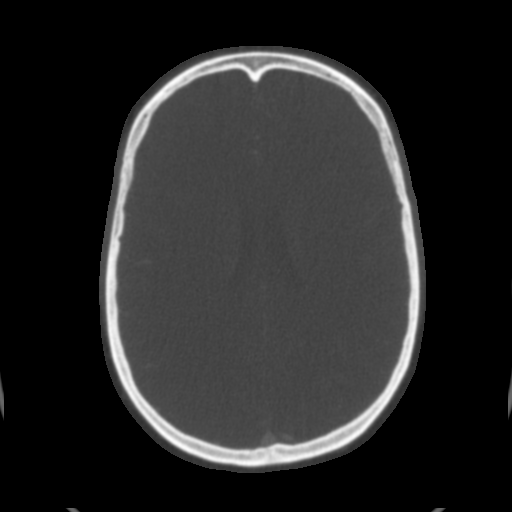
[im 183/256  brain]
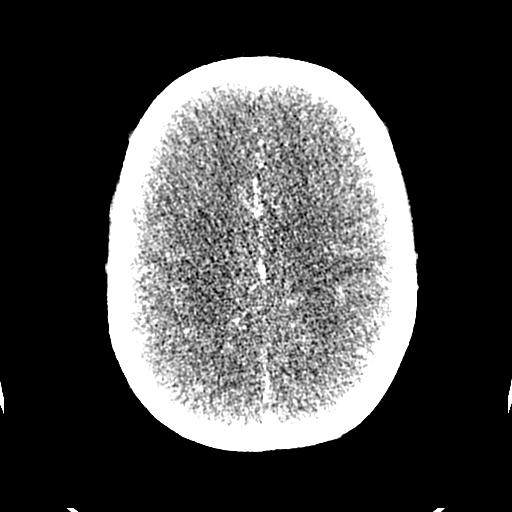
[im 201/256  brain]
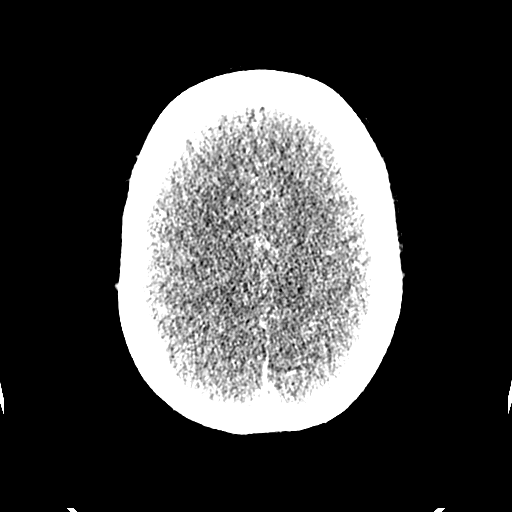
[im 219/256  brain]
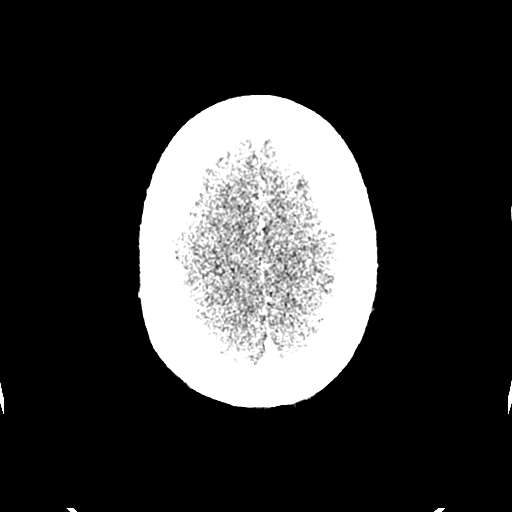
[im 237/256  brain]
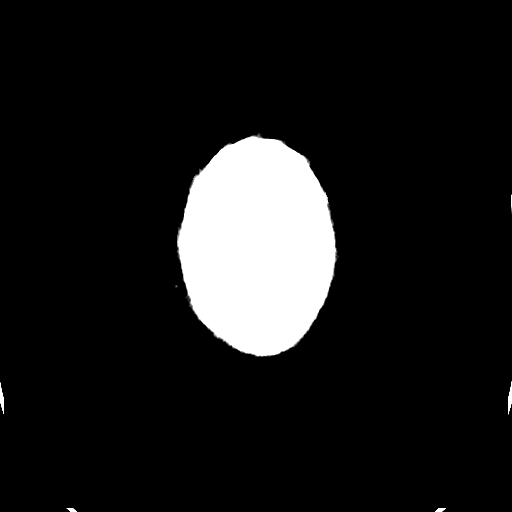
[im 237/256  bone]
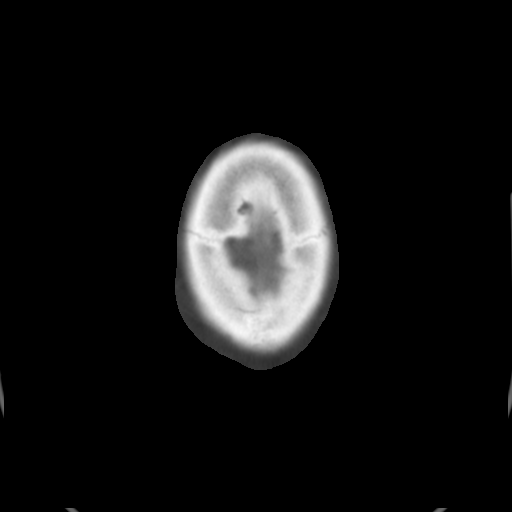

[13 of 40 positions shown; findings below may reference images not displayed]

FINDINGS: CT HEAD

Brain: Cerebral volume is normal. No midline shift,
ventriculomegaly, mass effect, evidence of mass lesion, intracranial
hemorrhage or evidence of cortically based acute infarction.
Gray-white matter differentiation is within normal limits throughout
the brain.

Calvarium and skull base: Negative. No osseous abnormality
identified.

Paranasal sinuses: There is mild bubbly opacity in the left sphenoid
sinus. Other paranasal sinuses, bilateral tympanic cavities, and
mastoids are clear.

Orbits: Visualized orbit soft tissues are within normal limits.
Negative scalp soft tissues. Negative visualized superficial and
deep soft tissue spaces of the face.

CTA HEAD

Vascular detail suboptimal due to CT slice capabilities (16 slice
configuration on this machine).

Posterior circulation: Patent distal vertebral arteries, codominant.
Patent vertebrobasilar junction. The basilar artery appears normal.
Patent PCA origins. Bilateral PCA branches are within normal limits.

Anterior circulation: Negative visualized distal cervical ICAs. Both
ICA siphons are patent. Both carotid termini are patent and appear
normal. MCA and ACA origins and first order segments appear normal.
Diminutive or absent anterior communicating artery. Visualized
bilateral MCA and ACA branches are within normal limits.

Allowing for the CT hardware limitations, there is no intracranial
aneurysm or arterial abnormality.

Venous sinuses: Patent. Visible superior internal jugular veins also
are patent.

Anatomic variants: None.

Delayed phase:No abnormal enhancement identified.
IMPRESSION: Negative CT and CTA appearance of the brain.

## 2015-09-05 MED ORDER — PENICILLIN G POTASSIUM 5000000 UNITS IJ SOLR
2.5000 10*6.[IU] | INTRAVENOUS | Status: DC
  Administered 2015-09-06 (×2): 2.5 10*6.[IU] via INTRAVENOUS
  Filled 2015-09-05 (×4): qty 2.5

## 2015-09-05 MED ORDER — MAGNESIUM SULFATE BOLUS VIA INFUSION
6.0000 g | Freq: Once | INTRAVENOUS | Status: AC
  Administered 2015-09-05: 6 g via INTRAVENOUS
  Filled 2015-09-05: qty 500

## 2015-09-05 MED ORDER — DEXAMETHASONE SODIUM PHOSPHATE 10 MG/ML IJ SOLN
10.0000 mg | Freq: Once | INTRAMUSCULAR | Status: AC
  Administered 2015-09-05: 10 mg via INTRAVENOUS
  Filled 2015-09-05: qty 1

## 2015-09-05 MED ORDER — SOD CITRATE-CITRIC ACID 500-334 MG/5ML PO SOLN
30.0000 mL | ORAL | Status: DC | PRN
  Administered 2015-09-06: 30 mL via ORAL
  Filled 2015-09-05 (×2): qty 15

## 2015-09-05 MED ORDER — LACTATED RINGERS IV SOLN
INTRAVENOUS | Status: DC
  Administered 2015-09-05: 18:00:00 via INTRAVENOUS

## 2015-09-05 MED ORDER — OXYTOCIN BOLUS FROM INFUSION: 500.0000 mL | INTRAVENOUS | Status: DC

## 2015-09-05 MED ORDER — MISOPROSTOL 25 MCG QUARTER TABLET
25.0000 ug | ORAL_TABLET | ORAL | Status: DC | PRN
  Administered 2015-09-06 (×2): 25 ug via VAGINAL
  Filled 2015-09-05 (×2): qty 0.25
  Filled 2015-09-05: qty 1

## 2015-09-05 MED ORDER — PRENATAL MULTIVITAMIN CH: 1.0000 | ORAL_TABLET | Freq: Every day | ORAL | Status: DC

## 2015-09-05 MED ORDER — OXYTOCIN 40 UNITS IN LACTATED RINGERS INFUSION - SIMPLE MED: 2.5000 [IU]/h | INTRAVENOUS | Status: DC

## 2015-09-05 MED ORDER — LACTATED RINGERS IV SOLN: INTRAVENOUS | Status: DC

## 2015-09-05 MED ORDER — ZOLPIDEM TARTRATE 5 MG PO TABS: 5.0000 mg | ORAL_TABLET | Freq: Every evening | ORAL | Status: DC | PRN

## 2015-09-05 MED ORDER — OXYCODONE-ACETAMINOPHEN 5-325 MG PO TABS: 2.0000 | ORAL_TABLET | Freq: Once | ORAL | Status: DC

## 2015-09-05 MED ORDER — LACTATED RINGERS IV BOLUS (SEPSIS)
1000.0000 mL | Freq: Once | INTRAVENOUS | Status: AC
  Administered 2015-09-05: 1000 mL via INTRAVENOUS

## 2015-09-05 MED ORDER — IOPAMIDOL (ISOVUE-370) INJECTION 76%
100.0000 mL | Freq: Once | INTRAVENOUS | Status: AC | PRN
  Administered 2015-09-05: 100 mL via INTRAVENOUS

## 2015-09-05 MED ORDER — ONDANSETRON HCL 4 MG/2ML IJ SOLN
4.0000 mg | Freq: Four times a day (QID) | INTRAMUSCULAR | Status: DC | PRN
  Administered 2015-09-06 (×2): 4 mg via INTRAVENOUS
  Filled 2015-09-05 (×2): qty 2

## 2015-09-05 MED ORDER — TERBUTALINE SULFATE 1 MG/ML IJ SOLN: 0.2500 mg | Freq: Once | INTRAMUSCULAR | Status: DC | PRN

## 2015-09-05 MED ORDER — DOCUSATE SODIUM 100 MG PO CAPS: 100.0000 mg | ORAL_CAPSULE | Freq: Every day | ORAL | Status: DC

## 2015-09-05 MED ORDER — ZOLPIDEM TARTRATE 5 MG PO TABS
5.0000 mg | ORAL_TABLET | Freq: Every evening | ORAL | Status: DC | PRN
  Administered 2015-09-05: 5 mg via ORAL
  Filled 2015-09-05: qty 1

## 2015-09-05 MED ORDER — MAGNESIUM SULFATE 50 % IJ SOLN
2.0000 g/h | INTRAVENOUS | Status: DC
  Administered 2015-09-05 – 2015-09-06 (×2): 2 g/h via INTRAVENOUS
  Filled 2015-09-05 (×2): qty 80

## 2015-09-05 MED ORDER — DIPHENHYDRAMINE HCL 50 MG/ML IJ SOLN
25.0000 mg | Freq: Once | INTRAMUSCULAR | Status: AC
  Administered 2015-09-05: 25 mg via INTRAVENOUS
  Filled 2015-09-05: qty 1

## 2015-09-05 MED ORDER — OXYCODONE-ACETAMINOPHEN 5-325 MG PO TABS: 1.0000 | ORAL_TABLET | ORAL | Status: DC | PRN

## 2015-09-05 MED ORDER — PENICILLIN G POTASSIUM 5000000 UNITS IJ SOLR
5.0000 10*6.[IU] | Freq: Once | INTRAMUSCULAR | Status: AC
  Administered 2015-09-06: 5 10*6.[IU] via INTRAVENOUS
  Filled 2015-09-05: qty 5

## 2015-09-05 MED ORDER — OXYCODONE HCL 5 MG PO TABS
10.0000 mg | ORAL_TABLET | Freq: Once | ORAL | Status: AC
  Administered 2015-09-05: 10 mg via ORAL
  Filled 2015-09-05: qty 2

## 2015-09-05 MED ORDER — ACETAMINOPHEN 325 MG PO TABS
650.0000 mg | ORAL_TABLET | ORAL | Status: DC | PRN
  Administered 2015-09-06: 650 mg via ORAL
  Filled 2015-09-05: qty 2

## 2015-09-05 MED ORDER — CALCIUM CARBONATE ANTACID 500 MG PO CHEW: 2.0000 | CHEWABLE_TABLET | ORAL | Status: DC | PRN

## 2015-09-05 MED ORDER — FLEET ENEMA 7-19 GM/118ML RE ENEM: 1.0000 | ENEMA | RECTAL | Status: DC | PRN

## 2015-09-05 MED ORDER — OXYCODONE-ACETAMINOPHEN 5-325 MG PO TABS: 2.0000 | ORAL_TABLET | ORAL | Status: DC | PRN

## 2015-09-05 MED ORDER — ACETAMINOPHEN 325 MG PO TABS: 650.0000 mg | ORAL_TABLET | ORAL | Status: DC | PRN

## 2015-09-05 MED ORDER — BUTORPHANOL TARTRATE 1 MG/ML IJ SOLN
1.0000 mg | INTRAMUSCULAR | Status: DC | PRN
  Administered 2015-09-06 (×2): 1 mg via INTRAVENOUS
  Filled 2015-09-05 (×2): qty 1

## 2015-09-05 MED ORDER — LACTATED RINGERS IV SOLN
500.0000 mL | INTRAVENOUS | Status: DC | PRN
  Administered 2015-09-06: 200 mL via INTRAVENOUS
  Administered 2015-09-06: 250 mL via INTRAVENOUS

## 2015-09-05 MED ORDER — METOCLOPRAMIDE HCL 5 MG/ML IJ SOLN
10.0000 mg | Freq: Once | INTRAMUSCULAR | Status: AC
  Administered 2015-09-05: 10 mg via INTRAVENOUS
  Filled 2015-09-05: qty 2

## 2015-09-05 NOTE — MAU Provider Note (Signed)
Chief Complaint:  Decreased Fetal Movement; Abdominal Pain; Fever; and Headache   First Provider Initiated Contact with Patient 09/05/15 1554      HPI: Jaclyn Jones is a 30 y.o. G2P1001 at 671w0d who presents to maternity admissions reporting: 1. Decreased fetal mvmt since 09/03/15 2. Intermittent low-grade fevers since 08/28/15. T Max 101.6. Resolves w/ Tylenol, but then returns.  3. HA since 08/28/15 4. Low back pain since 08/28/15  Has di/di twin pregnancy. Vtx/Vtx at last US. Planning vaginal delivery. One elevated BP in MAU 09/04/15. Normal Pre-E labs at that visit.   Headache Location: Generalized headache Quality: "Like something is going to burst", "worst HA of my life" Severity: 10/10 in pain scale Duration: 8 days Context: W/ Backache Timing: Intermittent Modifying factors: Slight improvement w/ Tylenol. Moderate improvement w/ Migraine cocktail at previous MAU visit.  Associated signs and symptoms: Pos for fevers, neck pain, back pain, seeing floaters, decreased concentration. Neg for LOF, VB, epigastric pain, difficulties w/ speech or gait, weakness, paralysis.    Location: Low back Quality: Sharp, shooting Severity: 7/10 in pain scale Duration: 8 days Course: worsening Timing: Intermittent Radiation: Up back and down backs of legs.  Modifying factors: No improvement w/ Tylenol.  Associated signs and symptoms: Pos for fever, HA.   Unsure if she is having contractions. Denies leakage of fluid or vaginal bleeding. Good fetal movement.   Past Medical History: Past Medical History  Diagnosis Date  . GERD (gastroesophageal reflux disease)   . Anxiety   . Vaginal Pap smear, abnormal     bx was normal  . Carpal tunnel syndrome   . Hx of varicella   . Depression     PP anxiety and depression no meds  . Complication of anesthesia     Low BP    Past obstetric history: OB History  Gravida Para Term Preterm AB SAB TAB Ectopic Multiple Living  2 1 1       1     #  Outcome Date GA Lbr Len/2nd Weight Sex Delivery Anes PTL Lv  2 Current           1 Term 09/03/13 5963w2d 14:29 / 01:10 9 lb 0.6 oz (4.099 kg) F Vag-Spont EPI  Y     Comments: WNL      Past Surgical History: Past Surgical History  Procedure Laterality Date  . Tonsillectomy    . Adenoidectomy and myringotomy with tube placement    . Wisdom tooth extraction       Family History: Family History  Problem Relation Age of Onset  . Cancer Maternal Grandmother     lung, smoker  . Diabetes Paternal Grandfather   . Hypertension Paternal Grandfather   . Cancer Paternal Grandfather   . Hearing loss Neg Hx   . Alcohol abuse Maternal Grandfather   . Rheum arthritis Paternal Grandmother     Social History: Social History  Substance Use Topics  . Smoking status: Never Smoker   . Smokeless tobacco: Never Used  . Alcohol Use: Yes     Comment: occ, none with preg    Allergies:  Allergies  Allergen Reactions  . Fentanyl-Bupivacaine-Nacl     Pt's blood pressure dropped when on this medication    Meds:  Prescriptions prior to admission  Medication Sig Dispense Refill Last Dose  . acetaminophen (TYLENOL) 500 MG tablet Take 500 mg by mouth every 6 (six) hours as needed for moderate pain.    09/05/2015 at Unknown time  .  IRON PO Take 1 tablet by mouth daily.   09/04/2015 at Unknown time  . Prenatal Vit-Fe Fumarate-FA (PRENATAL MULTIVITAMIN) TABS tablet Take 1 tablet by mouth daily at 12 noon.    09/05/2015 at Unknown time  . ranitidine (ZANTAC) 75 MG tablet Take 75-150 mg by mouth 2 (two) times daily as needed for heartburn.   09/04/2015 at Unknown time    I have reviewed patient's Past Medical Hx, Surgical Hx, Family Hx, Social Hx, medications and allergies.   ROS:  Review of Systems  Constitutional: Positive for fever, chills and diaphoresis. Negative for appetite change.  HENT: Negative for congestion, ear pain, rhinorrhea, sinus pressure, sneezing and sore throat.   Eyes: Positive for  photophobia and visual disturbance.  Respiratory: Positive for cough (mild, non-productive).   Cardiovascular: Positive for leg swelling.  Gastrointestinal: Negative for nausea, vomiting, abdominal pain and diarrhea.  Genitourinary: Negative for dysuria, flank pain, vaginal bleeding and vaginal discharge.       Neg for LOF  Musculoskeletal: Positive for back pain, neck pain and neck stiffness. Negative for myalgias.  Neurological: Positive for headaches. Negative for facial asymmetry, speech difficulty, weakness and numbness.  Psychiatric/Behavioral: Positive for decreased concentration.    Physical Exam   Patient Vitals for the past 24 hrs:  BP Temp Temp src Pulse Resp Height Weight  09/05/15 1702 144/95 mmHg - - 112 - - -  09/05/15 1700 - 98.8 F (37.1 C) Oral - 20 - -  09/05/15 1507 125/84 mmHg - - 120 - - -  09/05/15 1506 - 98.6 F (37 C) Oral - 18 - -  09/05/15 1452 - - - - - 5\' 4"  (1.626 m) 251 lb 12.8 oz (114.216 kg)   Constitutional: Well-developed, well-nourished female in moderate distress.  Cardiovascular: mild tachycardia Respiratory: normal effort, CTAB GI: Abd soft, non-tender, gravid appropriate for gestational age twins.  MS: Extremities nontender, 1+ edema, normal ROM. Low back mildly TTP.  Neurologic: Alert and oriented x 4. Normal speech and gait. Grossly non-focal.  GU: Neg CVAT.  Pelvic: NEFG, physiologic discharge, no blood, No LOF.   Dilation: Closed Exam by:: [place] CNM  FHT:  Baseline 140's x 2 , moderate variability, accelerations present, no decelerations Contractions: Frequent UI.    Labs: Results for orders placed or performed during the hospital encounter of 09/05/15 (from the past 24 hour(s))  Urinalysis, Routine w reflex microscopic (not at [facility])     Status: None   Collection Time: 09/05/15  2:48 PM  Result Value Ref Range   Color, Urine YELLOW YELLOW   APPearance CLEAR CLEAR   Specific Gravity, Urine 1.010 1.005 - 1.030   pH 5.5 5.0 -  8.0   Glucose, UA NEGATIVE NEGATIVE mg/dL   Hgb urine dipstick NEGATIVE NEGATIVE   Bilirubin Urine NEGATIVE NEGATIVE   Ketones, ur NEGATIVE NEGATIVE mg/dL   Protein, ur NEGATIVE NEGATIVE mg/dL   Nitrite NEGATIVE NEGATIVE   Leukocytes, UA NEGATIVE NEGATIVE  CBC with Differential/Platelet     Status: Abnormal   Collection Time: 09/05/15  4:32 PM  Result Value Ref Range   WBC 9.6 4.0 - 10.5 K/uL   RBC 3.95 3.87 - 5.11 MIL/uL   Hemoglobin 10.4 (L) 12.0 - 15.0 g/dL   HCT 38.7 (L) 56.4 - 33.2 %   MCV 82.8 78.0 - 100.0 fL   MCH 26.3 26.0 - 34.0 pg   MCHC 31.8 30.0 - 36.0 g/dL   RDW 95.1 (H) 88.4 - 16.6 %   Platelets  195 150 - 400 K/uL   Neutrophils Relative % 71 %   Lymphocytes Relative 22 %   Monocytes Relative 6 %   Eosinophils Relative 1 %   Basophils Relative 0 %   Other 0 %   Neutro Abs 6.8 1.7 - 7.7 K/uL   Lymphs Abs 2.1 0.7 - 4.0 K/uL   Monocytes Absolute 0.6 0.1 - 1.0 K/uL   Eosinophils Absolute 0.1 0.0 - 0.7 K/uL   Basophils Absolute 0.0 0.0 - 0.1 K/uL  Comprehensive metabolic panel     Status: Abnormal   Collection Time: 09/05/15  4:32 PM  Result Value Ref Range   Sodium 135 135 - 145 mmol/L   Potassium 3.5 3.5 - 5.1 mmol/L   Chloride 108 101 - 111 mmol/L   CO2 19 (L) 22 - 32 mmol/L   Glucose, Bld 95 65 - 99 mg/dL   BUN 6 6 - 20 mg/dL   Creatinine, Ser 1.61 0.44 - 1.00 mg/dL   Calcium 8.3 (L) 8.9 - 10.3 mg/dL   Total Protein 6.2 (L) 6.5 - 8.1 g/dL   Albumin 3.0 (L) 3.5 - 5.0 g/dL   AST 22 15 - 41 U/L   ALT 15 14 - 54 U/L   Alkaline Phosphatase 164 (H) 38 - 126 U/L   Total Bilirubin 0.4 0.3 - 1.2 mg/dL   GFR calc non Af Amer >60 >60 mL/min   GFR calc Af Amer >60 >60 mL/min   Anion gap 8 5 - 15    Imaging:  Pending  MAU Course: CBC w/ dif, CMET, UA, Migraine cocktail.  Headache now 6/10. Pt requesting more pain meds. Discussed Hx, exam, labs, w/ D.r Morris. Will order head CT and admit to Antenatal for Obs. Oxy IR ordered to avoid Tylenol and see if pt's  fever spikes again.    MDM: - Severe, intractable HA w/ report of fever at home associated w/ vision changes and neck pain concerning for emergent intracranial process or Pre-E.  - Fever of unknown origin. No evidence of chorio, but cannot rule out emergent condition.   Assessment: 1. Headache in pregnancy, antepartum, third trimester   2. Acute intractable headache   3. Transient hypertension of pregnancy in third trimester   4. Dichorionic diamniotic twin pregnancy in third trimester   5. Fever of unknown origin-per pt. None in MAU (post-antipyretic)   Plan: Head CT Observe on Antenatal per consult w/ Dr. Langston Masker.    [place], [place] 09/05/2015 6:44 PM

## 2015-09-05 NOTE — Progress Notes (Signed)
Both babies vtx.  Will start VMP induction.  Mitchel HonourMegan Arnelle Nale, DO

## 2015-09-05 NOTE — Progress Notes (Signed)
Head CT negative. Will start magnesium sulfate 6/2 and begin induction after confirming vtx/vtx presentation.    Mitchel HonourMegan Ieisha Gao, DO

## 2015-09-05 NOTE — MAU Note (Signed)
Lowgrade fevers noticed Tuesday and have been on and off for 1 week.  Pt has been taking Tylenol. DFM noticed since Sunday, babies not as active.  Pain 7/10 worse with movement

## 2015-09-05 NOTE — H&P (Signed)
Jaclyn Jones is a 30 y.o. female presenting for the third time this week for evaluation of HA, fever and low back pain.  She was previously discharged after her HA was treated and pre-eclampsia labs and BP were wnl.  She reports her last fever was this AM, Tm 101.5.  Last Tylenol dose was at 11 AM.  She has not URI or GI symptoms or sick contacts that would suggest a source for fever.  Per evaluating provider in MAU, the patient "looked like a fever had just broken." She has been monitoring her BP at home with pressures in the 110s-140s/60s-90.  She has "floaters" but no RUQ pain or SOB.  She has Di/Di twin pregnancy with all monitoring wnl.   She is currently feeling well after Oxi IR in MAU.  She is able to freely move her head but when untreated, her HA makes ROM painful.  She feels active FM x 2 currently.  No CTX.  CVX was closed on MAU check and per pt, both babies were VTX on last u/s.  She plans for vaginal delivery pending babies postitions.     Maternal Medical History:  Reason for admission: Nausea.  Fetal activity: Perceived fetal activity is normal.   Last perceived fetal movement was within the past hour.    Prenatal complications: PIH.   Prenatal Complications - Diabetes: none.    OB History    Gravida Para Term Preterm AB TAB SAB Ectopic Multiple Living   Past Medical History  Diagnosis Date  . GERD (gastroesophageal reflux disease)   . Anxiety   . Vaginal Pap smear, abnormal     bx was normal  . Carpal tunnel syndrome   . Hx of varicella   . Depression     PP anxiety and depression no meds  . Complication of anesthesia     Low BP   Past Surgical History  Procedure Laterality Date  . Tonsillectomy    . Adenoidectomy and myringotomy with tube placement    . Wisdom tooth extraction     Family History: family history includes Alcohol abuse in her maternal grandfather; Cancer in her maternal grandmother and paternal grandfather; Diabetes in her  paternal grandfather; Hypertension in her paternal grandfather; Rheum arthritis in her paternal grandmother. There is no history of Hearing loss. Social History:  reports that she has never smoked. She has never used smokeless tobacco. She reports that she drinks alcohol. She reports that she does not use illicit drugs.   Prenatal Transfer Tool  Maternal Diabetes: No Genetic Screening: Normal Maternal Ultrasounds/Referrals: Normal Fetal Ultrasounds or other Referrals:  None Maternal Substance Abuse:  No Significant Maternal Medications:  None Significant Maternal Lab Results:  Lab values include: Group B Strep positive Other Comments:  None  Review of Systems  Constitutional: Positive for fever and chills.  HENT: Negative for congestion and sore throat.   Eyes: Negative for blurred vision and double vision.  Respiratory: Negative for cough and shortness of breath.   Cardiovascular: Negative for chest pain and leg swelling.  Gastrointestinal: Positive for heartburn. Negative for nausea, vomiting and abdominal pain.  Musculoskeletal: Positive for back pain and neck pain.  Skin: Positive for itching.  Neurological: Positive for weakness and headaches.    Dilation: Closed Exam by:: [place] CNM Blood pressure 150/74, pulse 104, temperature 97.9 F (36.6 C), temperature source Oral, resp. rate 20, height  (1.626  m), weight 251 lb (113.853 kg), unknown if currently breastfeeding. Maternal Exam:  Abdomen: Patient reports no abdominal tenderness. Fundal height is c/w dates (twins).   Estimated fetal weight is 7#,7#.       Physical Exam  Constitutional: She is oriented to person, place, and time. She appears well-developed and well-nourished.  HENT:  Head: Normocephalic.  Neck: Normal range of motion.  GI: Soft. There is no tenderness. There is no rebound and no guarding.  Musculoskeletal: Normal range of motion.  Neurological: She is alert and oriented to person, place, and  time. She has normal reflexes.  Skin: Skin is warm and dry.  Psychiatric: She has a normal mood and affect. Her behavior is normal.    Prenatal labs: ABO, Rh: --/--/O NEG (05/30 1628) Antibody: POS (05/30 1628) Rubella: Immune (11/08 0000) RPR: Nonreactive (11/08 0000)  HBsAg: Negative (11/08 0000)  HIV: Non-reactive (11/08 0000)  GBS: Positive (11/08 0000)   Assessment/Plan: 30yo G2P1001 at 5431w0d -fever-unknown source; nl CBC and diff.  Head CT pending -Pre-eclampsia-plan induction of labor if CT clear -Di/Di twins-Cat I tracings x 2.  Will do u/s prior to induction -GBS pos-PCN in labor  Alegra Rost 09/05/2015, 8:47 PM

## 2015-09-06 ENCOUNTER — Encounter (HOSPITAL_COMMUNITY): Payer: Self-pay | Admitting: Certified Registered Nurse Anesthetist

## 2015-09-06 ENCOUNTER — Inpatient Hospital Stay (HOSPITAL_COMMUNITY): Payer: BC Managed Care – PPO | Admitting: Anesthesiology

## 2015-09-06 ENCOUNTER — Other Ambulatory Visit: Payer: BC Managed Care – PPO

## 2015-09-06 ENCOUNTER — Encounter (HOSPITAL_COMMUNITY)
Admission: AD | Disposition: A | Discharge status: Home / Self Care | Payer: Self-pay | Source: Ambulatory Visit | Attending: Obstetrics & Gynecology

## 2015-09-06 DIAGNOSIS — Z3A37 37 weeks gestation of pregnancy: Secondary | ICD-10-CM

## 2015-09-06 DIAGNOSIS — O30043 Twin pregnancy, dichorionic/diamniotic, third trimester: Secondary | ICD-10-CM

## 2015-09-06 LAB — COMPREHENSIVE METABOLIC PANEL
ALT: 17 U/L (ref 14–54)
ANION GAP: 8 (ref 5–15)
AST: 22 U/L (ref 15–41)
Albumin: 3.2 g/dL — ABNORMAL LOW (ref 3.5–5.0)
Alkaline Phosphatase: 185 U/L — ABNORMAL HIGH (ref 38–126)
BUN: 6 mg/dL (ref 6–20)
CHLORIDE: 108 mmol/L (ref 101–111)
CO2: 22 mmol/L (ref 22–32)
Calcium: 8 mg/dL — ABNORMAL LOW (ref 8.9–10.3)
Creatinine, Ser: 0.61 mg/dL (ref 0.44–1.00)
GFR calc non Af Amer: >60 mL/min (ref >60)
Glucose, Bld: 103 mg/dL — ABNORMAL HIGH (ref 65–99)
Potassium: 3.8 mmol/L (ref 3.5–5.1)
SODIUM: 138 mmol/L (ref 135–145)
Total Bilirubin: 0.4 mg/dL (ref 0.3–1.2)
Total Protein: 6.4 g/dL — ABNORMAL LOW (ref 6.5–8.1)

## 2015-09-06 LAB — CBC
HCT: 32.1 % — ABNORMAL LOW (ref 36.0–46.0)
Hemoglobin: 10.3 g/dL — ABNORMAL LOW (ref 12.0–15.0)
MCH: 26.5 pg (ref 26.0–34.0)
MCHC: 32.1 g/dL (ref 30.0–36.0)
MCV: 82.7 fL (ref 78.0–100.0)
PLATELETS: 181 10*3/uL (ref 150–400)
RBC: 3.88 MIL/uL (ref 3.87–5.11)
RDW: 16.2 % — AB (ref 11.5–15.5)
WBC: 11 10*3/uL — ABNORMAL HIGH (ref 4.0–10.5)

## 2015-09-06 LAB — RPR: RPR Ser Ql: NONREACTIVE

## 2015-09-06 SURGERY — Surgical Case
Anesthesia: Epidural

## 2015-09-06 MED ORDER — KETOROLAC TROMETHAMINE 30 MG/ML IJ SOLN
INTRAMUSCULAR | Status: AC
  Filled 2015-09-06: qty 1

## 2015-09-06 MED ORDER — LACTATED RINGERS IV SOLN
INTRAVENOUS | Status: DC | PRN
  Administered 2015-09-06 (×4): via INTRAVENOUS

## 2015-09-06 MED ORDER — IBUPROFEN 600 MG PO TABS
600.0000 mg | ORAL_TABLET | Freq: Four times a day (QID) | ORAL | Status: DC
  Administered 2015-09-06 – 2015-09-08 (×7): 600 mg via ORAL
  Filled 2015-09-06 (×8): qty 1

## 2015-09-06 MED ORDER — LIDOCAINE HCL (PF) 1 % IJ SOLN
INTRAMUSCULAR | Status: DC | PRN
  Administered 2015-09-06: 3 mL via EPIDURAL
  Administered 2015-09-06: 4 mL via EPIDURAL

## 2015-09-06 MED ORDER — FENTANYL CITRATE (PF) 100 MCG/2ML IJ SOLN
INTRAMUSCULAR | Status: AC
  Filled 2015-09-06: qty 2

## 2015-09-06 MED ORDER — KETOROLAC TROMETHAMINE 30 MG/ML IJ SOLN: 30.0000 mg | Freq: Four times a day (QID) | INTRAMUSCULAR | Status: AC | PRN

## 2015-09-06 MED ORDER — NALBUPHINE HCL 10 MG/ML IJ SOLN: 5.0000 mg | INTRAMUSCULAR | Status: DC | PRN

## 2015-09-06 MED ORDER — LIDOCAINE-EPINEPHRINE (PF) 2 %-1:200000 IJ SOLN
INTRAMUSCULAR | Status: AC
  Filled 2015-09-06: qty 20

## 2015-09-06 MED ORDER — MORPHINE SULFATE (PF) 0.5 MG/ML IJ SOLN
INTRAMUSCULAR | Status: DC | PRN
  Administered 2015-09-06: 4 mg via EPIDURAL

## 2015-09-06 MED ORDER — NALBUPHINE HCL 10 MG/ML IJ SOLN: 5.0000 mg | Freq: Once | INTRAMUSCULAR | Status: DC | PRN

## 2015-09-06 MED ORDER — FENTANYL CITRATE (PF) 100 MCG/2ML IJ SOLN: 25.0000 ug | INTRAMUSCULAR | Status: DC | PRN

## 2015-09-06 MED ORDER — SIMETHICONE 80 MG PO CHEW: 80.0000 mg | CHEWABLE_TABLET | ORAL | Status: DC | PRN

## 2015-09-06 MED ORDER — ZOLPIDEM TARTRATE 5 MG PO TABS: 5.0000 mg | ORAL_TABLET | Freq: Every evening | ORAL | Status: DC | PRN

## 2015-09-06 MED ORDER — OXYTOCIN 40 UNITS IN LACTATED RINGERS INFUSION - SIMPLE MED
1.0000 m[IU]/min | INTRAVENOUS | Status: DC
  Administered 2015-09-06: 1 m[IU]/min via INTRAVENOUS
  Filled 2015-09-06: qty 1000

## 2015-09-06 MED ORDER — LIDOCAINE-EPINEPHRINE (PF) 2 %-1:200000 IJ SOLN
INTRAMUSCULAR | Status: DC | PRN
  Administered 2015-09-06: 4 mL via EPIDURAL
  Administered 2015-09-06: 1 mL via EPIDURAL
  Administered 2015-09-06: 4 mL via EPIDURAL
  Administered 2015-09-06: 1 mL via EPIDURAL
  Administered 2015-09-06: 6 mL via EPIDURAL

## 2015-09-06 MED ORDER — ACETAMINOPHEN 500 MG PO TABS
1000.0000 mg | ORAL_TABLET | Freq: Four times a day (QID) | ORAL | Status: AC
  Administered 2015-09-06 – 2015-09-07 (×4): 1000 mg via ORAL
  Filled 2015-09-06 (×4): qty 2

## 2015-09-06 MED ORDER — PHENYLEPHRINE HCL 10 MG/ML IJ SOLN
INTRAMUSCULAR | Status: DC | PRN
  Administered 2015-09-06: 40 ug via INTRAVENOUS
  Administered 2015-09-06: 80 ug via INTRAVENOUS
  Administered 2015-09-06 (×2): 40 ug via INTRAVENOUS

## 2015-09-06 MED ORDER — DIPHENHYDRAMINE HCL 25 MG PO CAPS
25.0000 mg | ORAL_CAPSULE | ORAL | Status: DC | PRN
  Filled 2015-09-06: qty 1

## 2015-09-06 MED ORDER — DIPHENHYDRAMINE HCL 50 MG/ML IJ SOLN: 12.5000 mg | INTRAMUSCULAR | Status: DC | PRN

## 2015-09-06 MED ORDER — KETOROLAC TROMETHAMINE 30 MG/ML IJ SOLN: 30.0000 mg | Freq: Four times a day (QID) | INTRAMUSCULAR | Status: DC | PRN

## 2015-09-06 MED ORDER — TETANUS-DIPHTH-ACELL PERTUSSIS 5-2.5-18.5 LF-MCG/0.5 IM SUSP: 0.5000 mL | Freq: Once | INTRAMUSCULAR | Status: DC

## 2015-09-06 MED ORDER — PHENYLEPHRINE 40 MCG/ML (10ML) SYRINGE FOR IV PUSH (FOR BLOOD PRESSURE SUPPORT)
80.0000 ug | PREFILLED_SYRINGE | INTRAVENOUS | Status: DC | PRN
  Administered 2015-09-06 (×2): 80 ug via INTRAVENOUS
  Filled 2015-09-06 (×2): qty 10

## 2015-09-06 MED ORDER — NALOXONE HCL 2 MG/2ML IJ SOSY: 1.0000 ug/kg/h | PREFILLED_SYRINGE | INTRAVENOUS | Status: DC | PRN

## 2015-09-06 MED ORDER — TERBUTALINE SULFATE 1 MG/ML IJ SOLN: 0.2500 mg | Freq: Once | INTRAMUSCULAR | Status: DC | PRN

## 2015-09-06 MED ORDER — EPHEDRINE 5 MG/ML INJ
10.0000 mg | INTRAVENOUS | Status: DC | PRN
  Filled 2015-09-06: qty 4

## 2015-09-06 MED ORDER — MENTHOL 3 MG MT LOZG: 1.0000 | LOZENGE | OROMUCOSAL | Status: DC | PRN

## 2015-09-06 MED ORDER — SODIUM CHLORIDE 0.9% FLUSH: 3.0000 mL | INTRAVENOUS | Status: DC | PRN

## 2015-09-06 MED ORDER — PROCHLORPERAZINE EDISYLATE 5 MG/ML IJ SOLN: 10.0000 mg | Freq: Once | INTRAMUSCULAR | Status: DC | PRN

## 2015-09-06 MED ORDER — SCOPOLAMINE 1 MG/3DAYS TD PT72
MEDICATED_PATCH | TRANSDERMAL | Status: DC | PRN
  Administered 2015-09-06: 1 via TRANSDERMAL

## 2015-09-06 MED ORDER — PHENYLEPHRINE 40 MCG/ML (10ML) SYRINGE FOR IV PUSH (FOR BLOOD PRESSURE SUPPORT)
PREFILLED_SYRINGE | INTRAVENOUS | Status: AC
  Filled 2015-09-06: qty 10

## 2015-09-06 MED ORDER — ACETAMINOPHEN 325 MG PO TABS
650.0000 mg | ORAL_TABLET | ORAL | Status: DC | PRN
Start: 2015-09-06 — End: 2015-09-08

## 2015-09-06 MED ORDER — PHENYLEPHRINE 40 MCG/ML (10ML) SYRINGE FOR IV PUSH (FOR BLOOD PRESSURE SUPPORT)
80.0000 ug | PREFILLED_SYRINGE | INTRAVENOUS | Status: DC | PRN
  Administered 2015-09-06: 80 ug via INTRAVENOUS
  Filled 2015-09-06 (×2): qty 10

## 2015-09-06 MED ORDER — PRENATAL MULTIVITAMIN CH
1.0000 | ORAL_TABLET | Freq: Every day | ORAL | Status: DC
  Administered 2015-09-07 – 2015-09-08 (×2): 1 via ORAL
  Filled 2015-09-06 (×3): qty 1

## 2015-09-06 MED ORDER — BUPIVACAINE HCL (PF) 0.25 % IJ SOLN
INTRAMUSCULAR | Status: AC
  Filled 2015-09-06: qty 20

## 2015-09-06 MED ORDER — SCOPOLAMINE 1 MG/3DAYS TD PT72: 1.0000 | MEDICATED_PATCH | Freq: Once | TRANSDERMAL | Status: DC

## 2015-09-06 MED ORDER — MEPERIDINE HCL 25 MG/ML IJ SOLN: 6.2500 mg | INTRAMUSCULAR | Status: DC | PRN

## 2015-09-06 MED ORDER — MAGNESIUM SULFATE 50 % IJ SOLN
2.0000 g/h | INTRAVENOUS | Status: DC
  Filled 2015-09-06: qty 80

## 2015-09-06 MED ORDER — MEPERIDINE HCL 25 MG/ML IJ SOLN
INTRAMUSCULAR | Status: DC | PRN
  Administered 2015-09-06 (×2): 12.5 mg via INTRAVENOUS

## 2015-09-06 MED ORDER — FENTANYL 2.5 MCG/ML BUPIVACAINE 1/10 % EPIDURAL INFUSION (WH - ANES)
14.0000 mL/h | INTRAMUSCULAR | Status: DC | PRN
  Administered 2015-09-06: 8 mL/h via EPIDURAL
  Filled 2015-09-06: qty 125

## 2015-09-06 MED ORDER — OXYTOCIN 40 UNITS IN LACTATED RINGERS INFUSION - SIMPLE MED: 2.5000 [IU]/h | INTRAVENOUS | Status: AC

## 2015-09-06 MED ORDER — FENTANYL CITRATE (PF) 100 MCG/2ML IJ SOLN
25.0000 ug | Freq: Once | INTRAMUSCULAR | Status: AC
  Administered 2015-09-06 (×2): 25 ug via INTRAVENOUS

## 2015-09-06 MED ORDER — FENTANYL CITRATE (PF) 100 MCG/2ML IJ SOLN
INTRAMUSCULAR | Status: AC
  Administered 2015-09-06: 25 ug via INTRAVENOUS
  Filled 2015-09-06: qty 2

## 2015-09-06 MED ORDER — OXYCODONE HCL 5 MG PO TABS
10.0000 mg | ORAL_TABLET | ORAL | Status: DC | PRN
  Administered 2015-09-07 – 2015-09-08 (×3): 10 mg via ORAL
  Filled 2015-09-06 (×3): qty 2

## 2015-09-06 MED ORDER — MEPERIDINE HCL 25 MG/ML IJ SOLN
INTRAMUSCULAR | Status: AC
  Filled 2015-09-06: qty 1

## 2015-09-06 MED ORDER — OXYTOCIN 40 UNITS IN LACTATED RINGERS INFUSION - SIMPLE MED
INTRAVENOUS | Status: DC | PRN
  Administered 2015-09-06: 20 mL via INTRAVENOUS
  Administered 2015-09-06: 680 mL via INTRAVENOUS

## 2015-09-06 MED ORDER — ONDANSETRON HCL 4 MG/2ML IJ SOLN
INTRAMUSCULAR | Status: AC
  Filled 2015-09-06: qty 2

## 2015-09-06 MED ORDER — SODIUM BICARBONATE 8.4 % IV SOLN
INTRAVENOUS | Status: AC
  Filled 2015-09-06: qty 50

## 2015-09-06 MED ORDER — LACTATED RINGERS IV SOLN
INTRAVENOUS | Status: DC
  Administered 2015-09-07: 08:00:00 via INTRAVENOUS

## 2015-09-06 MED ORDER — LACTATED RINGERS IV SOLN
500.0000 mL | Freq: Once | INTRAVENOUS | Status: AC
  Administered 2015-09-06: 500 mL via INTRAVENOUS

## 2015-09-06 MED ORDER — OXYCODONE HCL 5 MG PO TABS: 5.0000 mg | ORAL_TABLET | ORAL | Status: DC | PRN

## 2015-09-06 MED ORDER — OXYTOCIN 10 UNIT/ML IJ SOLN
INTRAMUSCULAR | Status: AC
  Filled 2015-09-06: qty 4

## 2015-09-06 MED ORDER — SODIUM CHLORIDE 0.9 % IR SOLN
Status: DC | PRN
Start: 2015-09-06 — End: 2015-09-06
  Administered 2015-09-06: 1

## 2015-09-06 MED ORDER — CEFAZOLIN SODIUM-DEXTROSE 2-3 GM-% IV SOLR
INTRAVENOUS | Status: DC | PRN
  Administered 2015-09-06: 2 g via INTRAVENOUS

## 2015-09-06 MED ORDER — ONDANSETRON HCL 4 MG/2ML IJ SOLN
INTRAMUSCULAR | Status: DC | PRN
  Administered 2015-09-06: 4 mg via INTRAVENOUS

## 2015-09-06 MED ORDER — MORPHINE SULFATE (PF) 0.5 MG/ML IJ SOLN
INTRAMUSCULAR | Status: AC
  Filled 2015-09-06: qty 10

## 2015-09-06 MED ORDER — SENNOSIDES-DOCUSATE SODIUM 8.6-50 MG PO TABS
2.0000 | ORAL_TABLET | ORAL | Status: DC
  Administered 2015-09-06 – 2015-09-08 (×2): 2 via ORAL
  Filled 2015-09-06 (×2): qty 2

## 2015-09-06 MED ORDER — NALOXONE HCL 0.4 MG/ML IJ SOLN: 0.4000 mg | INTRAMUSCULAR | Status: DC | PRN

## 2015-09-06 MED ORDER — FENTANYL CITRATE (PF) 100 MCG/2ML IJ SOLN
INTRAMUSCULAR | Status: DC | PRN
  Administered 2015-09-06: 100 ug via INTRAVENOUS

## 2015-09-06 MED ORDER — NALBUPHINE HCL 10 MG/ML IJ SOLN
5.0000 mg | Freq: Once | INTRAMUSCULAR | Status: DC | PRN
Start: 2015-09-06 — End: 2015-09-08

## 2015-09-06 MED ORDER — ONDANSETRON HCL 4 MG/2ML IJ SOLN: 4.0000 mg | Freq: Three times a day (TID) | INTRAMUSCULAR | Status: DC | PRN

## 2015-09-06 MED ORDER — SIMETHICONE 80 MG PO CHEW
80.0000 mg | CHEWABLE_TABLET | Freq: Three times a day (TID) | ORAL | Status: DC
  Administered 2015-09-07 – 2015-09-08 (×4): 80 mg via ORAL
  Filled 2015-09-06 (×4): qty 1

## 2015-09-06 MED ORDER — SCOPOLAMINE 1 MG/3DAYS TD PT72
MEDICATED_PATCH | TRANSDERMAL | Status: AC
  Filled 2015-09-06: qty 1

## 2015-09-06 MED ORDER — DIPHENHYDRAMINE HCL 25 MG PO CAPS: 25.0000 mg | ORAL_CAPSULE | Freq: Four times a day (QID) | ORAL | Status: DC | PRN

## 2015-09-06 MED ORDER — SIMETHICONE 80 MG PO CHEW
80.0000 mg | CHEWABLE_TABLET | ORAL | Status: DC
  Administered 2015-09-06 – 2015-09-08 (×2): 80 mg via ORAL
  Filled 2015-09-06 (×2): qty 1

## 2015-09-06 MED ORDER — BUPIVACAINE HCL (PF) 0.25 % IJ SOLN
INTRAMUSCULAR | Status: DC | PRN
  Administered 2015-09-06: 20 mL

## 2015-09-06 MED ORDER — METOCLOPRAMIDE HCL 5 MG/ML IJ SOLN
INTRAMUSCULAR | Status: DC | PRN
  Administered 2015-09-06: 10 mg via INTRAVENOUS

## 2015-09-06 MED ORDER — COCONUT OIL OIL: 1.0000 "application " | TOPICAL_OIL | Status: DC | PRN

## 2015-09-06 MED ORDER — EPHEDRINE SULFATE 50 MG/ML IJ SOLN
INTRAMUSCULAR | Status: DC | PRN
  Administered 2015-09-06: 10 mg via INTRAVENOUS

## 2015-09-06 SURGICAL SUPPLY — 38 items
BAG DECANTER FOR FLEXI CONT (MISCELLANEOUS) ×3 IMPLANT
BENZOIN TINCTURE PRP APPL 2/3 (GAUZE/BANDAGES/DRESSINGS) ×3 IMPLANT
CLAMP CORD UMBIL (MISCELLANEOUS) IMPLANT
CLOSURE STERI STRIP 1/2 X4 (GAUZE/BANDAGES/DRESSINGS) ×3 IMPLANT
CLOSURE WOUND 1/2 X4 (GAUZE/BANDAGES/DRESSINGS)
CLOTH BEACON ORANGE TIMEOUT ST (SAFETY) ×3 IMPLANT
CONTAINER PREFILL 10% NBF 15ML (MISCELLANEOUS) IMPLANT
DRSG OPSITE POSTOP 4X10 (GAUZE/BANDAGES/DRESSINGS) ×3 IMPLANT
DURAPREP 26ML APPLICATOR (WOUND CARE) ×3 IMPLANT
ELECT REM PT RETURN 9FT ADLT (ELECTROSURGICAL) ×3
ELECTRODE REM PT RTRN 9FT ADLT (ELECTROSURGICAL) ×1 IMPLANT
EXTRACTOR VACUUM M CUP 4 TUBE (SUCTIONS) IMPLANT
EXTRACTOR VACUUM M CUP 4' TUBE (SUCTIONS)
GAUZE SPONGE 4X4 12PLY STRL LF (GAUZE/BANDAGES/DRESSINGS) ×3 IMPLANT
GLOVE BIO SURGEON STRL SZ8 (GLOVE) ×3 IMPLANT
GLOVE BIOGEL PI IND STRL 7.0 (GLOVE) ×1 IMPLANT
GLOVE BIOGEL PI INDICATOR 7.0 (GLOVE) ×2
GOWN STRL REUS W/TWL LRG LVL3 (GOWN DISPOSABLE) ×6 IMPLANT
KIT ABG SYR 3ML LUER SLIP (SYRINGE) ×3 IMPLANT
LIQUID BAND (GAUZE/BANDAGES/DRESSINGS) IMPLANT
NEEDLE HYPO 25X5/8 SAFETYGLIDE (NEEDLE) ×3 IMPLANT
NS IRRIG 1000ML POUR BTL (IV SOLUTION) ×3 IMPLANT
PACK C SECTION WH (CUSTOM PROCEDURE TRAY) ×3 IMPLANT
PAD ABD 8X10 STRL (GAUZE/BANDAGES/DRESSINGS) ×3 IMPLANT
PAD OB MATERNITY 4.3X12.25 (PERSONAL CARE ITEMS) ×3 IMPLANT
PENCIL SMOKE EVAC W/HOLSTER (ELECTROSURGICAL) ×3 IMPLANT
STRIP CLOSURE SKIN 1/2X4 (GAUZE/BANDAGES/DRESSINGS) IMPLANT
SUT MNCRL 0 VIOLET CTX 36 (SUTURE) ×4 IMPLANT
SUT MONOCRYL 0 CTX 36 (SUTURE) ×8
SUT PDS AB 0 CTX 60 (SUTURE) ×3 IMPLANT
SUT PLAIN 0 NONE (SUTURE) IMPLANT
SUT PLAIN 2 0 (SUTURE)
SUT PLAIN 2 0 XLH (SUTURE) IMPLANT
SUT PLAIN ABS 2-0 CT1 27XMFL (SUTURE) IMPLANT
SUT VIC AB 4-0 KS 27 (SUTURE) ×3 IMPLANT
TAPE CLOTH SURG 4X10 WHT LF (GAUZE/BANDAGES/DRESSINGS) ×3 IMPLANT
TOWEL OR 17X24 6PK STRL BLUE (TOWEL DISPOSABLE) ×3 IMPLANT
TRAY FOLEY CATH SILVER 14FR (SET/KITS/TRAYS/PACK) ×3 IMPLANT

## 2015-09-06 NOTE — Progress Notes (Signed)
Cx 6/C/-1 (nurse called cx 5.5-6 at about 3:40pm) FHT cat one x 2 U/S vtx/vtx UCs q2-3 min Epidural in D/W patient - will check cervix in about 30-45 min. If no further change will proceed to cesarean section. Patient requests BTL if C/S and babies OK. If babies not OK will not do BTL. D/W BTL and permanence, alternatives, failure rate and increased ectopic risk. She states she understands.

## 2015-09-06 NOTE — Progress Notes (Signed)
H/A continue on and off this am requiring some medication No blurry vision, no neck stiffness, no epigastric pain  Filed Vitals:   09/06/15 0701 09/06/15 0730  BP: 138/96 128/83  Pulse: 115 103  Temp:  97.9 F (36.6 C)  Resp: 16 16   BP labile-none have required treatment  Lungs CTA Cor RRR Abdomen no epigastric tenderness DTR 2+ in LE  FHT cat one x 2 UCs mild and irregular  Cx 2/80/very soft/-3/ballotable AROM attempted unsuccessful  Magnesium sulfate running  A/P: Preeclampsia         Repeat labs         Continue magnesium sulfate         Begin pitocin-risks reviewed with patient         D/W patient delivery and possible emergent cesarean section         Patient states she understands and agrees

## 2015-09-06 NOTE — Progress Notes (Signed)
HA treated with fentanyl x 1 Filed Vitals:   09/06/15 1209 09/06/15 1240  BP: 130/71 124/76  Pulse: 103 98  Temp:    Resp: 15 18   DTR 2+ without clonus  FHT cat one x 2     A baseline 115-120 with accels and good response to scalp stim     B baseline 120s with accels UCs q2-4 min Cx 4-5/C/-1 AROM-trickle of clear fluid  Results for orders placed or performed during the hospital encounter of 09/05/15 (from the past 24 hour(s))  Urinalysis, Routine w reflex microscopic (not at [facility])     Status: None   Collection Time: 09/05/15  2:48 PM  Result Value Ref Range   Color, Urine YELLOW YELLOW   APPearance CLEAR CLEAR   Specific Gravity, Urine 1.010 1.005 - 1.030   pH 5.5 5.0 - 8.0   Glucose, UA NEGATIVE NEGATIVE mg/dL   Hgb urine dipstick NEGATIVE NEGATIVE   Bilirubin Urine NEGATIVE NEGATIVE   Ketones, ur NEGATIVE NEGATIVE mg/dL   Protein, ur NEGATIVE NEGATIVE mg/dL   Nitrite NEGATIVE NEGATIVE   Leukocytes, UA NEGATIVE NEGATIVE  Type and screen [facility]     Status: None (Preliminary result)   Collection Time: 09/05/15  4:28 PM  Result Value Ref Range   ABO/RH(D) O NEG    Antibody Screen POS    Sample Expiration 09/08/2015    DAT, IgG NEG    Antibody Identification PASSIVELY ACQUIRED ANTI-D    Unit Number [id number]    Blood Component Type RBC LR PHER1    Unit division 00    Status of Unit ALLOCATED    Transfusion Status OK TO TRANSFUSE    Crossmatch Result COMPATIBLE    Unit Number [id number]    Blood Component Type RED CELLS,LR    Unit division 00    Status of Unit ALLOCATED    Transfusion Status OK TO TRANSFUSE    Crossmatch Result COMPATIBLE   CBC with Differential/Platelet     Status: Abnormal   Collection Time: 09/05/15  4:32 PM  Result Value Ref Range   WBC 9.6 4.0 - 10.5 K/uL   RBC 3.95 3.87 - 5.11 MIL/uL   Hemoglobin 10.4 (L) 12.0 - 15.0 g/dL   HCT 30.8 (L) 65.7 - 84.6 %   MCV 82.8 78.0 - 100.0 fL   MCH 26.3 26.0 - 34.0  pg   MCHC 31.8 30.0 - 36.0 g/dL   RDW 96.2 (H) 95.2 - 84.1 %   Platelets 195 150 - 400 K/uL   Neutrophils Relative % 71 %   Lymphocytes Relative 22 %   Monocytes Relative 6 %   Eosinophils Relative 1 %   Basophils Relative 0 %   Other 0 %   Neutro Abs 6.8 1.7 - 7.7 K/uL   Lymphs Abs 2.1 0.7 - 4.0 K/uL   Monocytes Absolute 0.6 0.1 - 1.0 K/uL   Eosinophils Absolute 0.1 0.0 - 0.7 K/uL   Basophils Absolute 0.0 0.0 - 0.1 K/uL  Comprehensive metabolic panel     Status: Abnormal   Collection Time: 09/05/15  4:32 PM  Result Value Ref Range   Sodium 135 135 - 145 mmol/L   Potassium 3.5 3.5 - 5.1 mmol/L   Chloride 108 101 - 111 mmol/L   CO2 19 (L) 22 - 32 mmol/L   Glucose, Bld 95 65 - 99 mg/dL   BUN 6 6 - 20 mg/dL   Creatinine, Ser 3.24 0.44 - 1.00 mg/dL  Calcium 8.3 (L) 8.9 - 10.3 mg/dL   Total Protein 6.2 (L) 6.5 - 8.1 g/dL   Albumin 3.0 (L) 3.5 - 5.0 g/dL   AST 22 15 - 41 U/L   ALT 15 14 - 54 U/L   Alkaline Phosphatase 164 (H) 38 - 126 U/L   Total Bilirubin 0.4 0.3 - 1.2 mg/dL   GFR calc non Af Amer >60 >60 mL/min   GFR calc Af Amer >60 >60 mL/min   Anion gap 8 5 - 15  CBC     Status: Abnormal   Collection Time: 09/06/15  8:01 AM  Result Value Ref Range   WBC 11.0 (H) 4.0 - 10.5 K/uL   RBC 3.88 3.87 - 5.11 MIL/uL   Hemoglobin 10.3 (L) 12.0 - 15.0 g/dL   HCT 16.132.1 (L) 09.636.0 - 04.546.0 %   MCV 82.7 78.0 - 100.0 fL   MCH 26.5 26.0 - 34.0 pg   MCHC 32.1 30.0 - 36.0 g/dL   RDW 40.916.2 (H) 81.111.5 - 91.415.5 %   Platelets 181 150 - 400 K/uL  Comprehensive metabolic panel     Status: Abnormal   Collection Time: 09/06/15  8:01 AM  Result Value Ref Range   Sodium 138 135 - 145 mmol/L   Potassium 3.8 3.5 - 5.1 mmol/L   Chloride 108 101 - 111 mmol/L   CO2 22 22 - 32 mmol/L   Glucose, Bld 103 (H) 65 - 99 mg/dL   BUN 6 6 - 20 mg/dL   Creatinine, Ser 7.820.61 0.44 - 1.00 mg/dL   Calcium 8.0 (L) 8.9 - 10.3 mg/dL   Total Protein 6.4 (L) 6.5 - 8.1 g/dL   Albumin 3.2 (L) 3.5 - 5.0 g/dL   AST 22 15 -  41 U/L   ALT 17 14 - 54 U/L   Alkaline Phosphatase 185 (H) 38 - 126 U/L   Total Bilirubin 0.4 0.3 - 1.2 mg/dL   GFR calc non Af Amer >60 >60 mL/min   GFR calc Af Amer >60 >60 mL/min   Anion gap 8 5 - 15   D/W patient above

## 2015-09-06 NOTE — Brief Op Note (Signed)
09/05/2015 - 09/06/2015  7:26 PM  PATIENT:  Jaclyn Jones  30 y.o. female  PRE-OPERATIVE DIAGNOSIS:  Primary Cesarean Section for Arrest of Dilation, twins  POST-OPERATIVE DIAGNOSIS:  Primary Cesarean Section for Arrest of Dilation, twins  PROCEDURE:  Procedure(s): CESAREAN SECTION (N/A)  SURGEON:  Surgeon(s) and Role:    * Tilda BurrowJohn Ferguson V, MD - Assisting    * Harold HedgeJames Linzi Ohlinger, MD - Primary  PHYSICIAN ASSISTANT:   ASSISTANTS: none   ANESTHESIA:   epidural  EBL:  Total I/O In: 1000 [I.V.:1000] Out: 300 [Blood:300]  BLOOD ADMINISTERED:none  DRAINS: Urinary Catheter (Foley)   LOCAL MEDICATIONS USED:  MARCAINE    and Amount: 20 ml  SPECIMEN:  Source of Specimen:  placentas  DISPOSITION OF SPECIMEN:  PATHOLOGY  COUNTS:  YES  TOURNIQUET:  * No tourniquets in log *  DICTATION: .Other Dictation: Dictation Number [id number]  PLAN OF CARE: Admit to inpatient   PATIENT DISPOSITION:  PACU - hemodynamically stable.   Delay start of Pharmacological VTE agent (>24hrs) due to surgical blood loss or risk of bleeding: not applicable

## 2015-09-06 NOTE — Op Note (Signed)
Jaclyn Jones, Jaclyn Jones                ACCOUNT NO.:  [id number]  MEDICAL RECORD NO.:  [id number]  LOCATION:  9305                          FACILITY:  [facility]  PHYSICIAN:  Guy Sandifer. Henderson Cloud, M.D. DATE OF BIRTH:  02-Jul-1985  DATE OF PROCEDURE:  09/06/2015 DATE OF DISCHARGE:                              OPERATIVE REPORT   LOCATION:  [facility], [place], [place].  PREOPERATIVE DIAGNOSES: 1. Twin gestation. 2. Arrest of dilation.  POSTOPERATIVE DIAGNOSES: 1. Twin gestation. 2. Arrest of dilation.  PROCEDURE:  Primary low transverse cesarean section.  SURGEON:  Guy Sandifer. Henderson Cloud, M.D.  ASSISTANT:  Christin Bach, MD.  ANESTHESIA:  Mal Amabile, M.D.  ESTIMATED BLOOD LOSS:  800 mL.  SPECIMENS:  Placenta to Pathology.  FINDINGS:  Viable female infant. 1. Baby A:  Apgars 9 and 10.  Weight and arterial cord pH pending. 2. Baby B:  Apgars 8 and 8.  Arterial cord pH and weight pending.  INDICATIONS AND CONSENT:  This patient is a 30 year old G2, P1, at 37 weeks who has had recurrent persistent and worsening headache.  A CT scan was negative.  The patient was admitted and placed on magnesium sulfate for seizure prophylaxis for diagnosis of preeclampsia.  She underwent Cytotec induction and progresses to 6 cm dilation, complete effacement, -1 station.  Babies were vertex.  After 2-1/2 hours of good labor, the patient fails to progress any further.  Recommendation for cesarean section was made.  Potential risks and complications were reviewed preoperatively, including but not limited to infection, organ damage, bleeding requiring transfusion of blood products with HIV and hepatitis acquisition, DVT, PE, pneumonia.  All questions were answered. The patient states she understands and agrees.  The patient initially requested tubal ligation.  However, prior to the procedure, the patient stated she did not want a tubal ligation.  PROCEDURE IN DETAIL:  The patient was  taken to the operating room where she was identified, placed in a dorsal supine position with a 15-degree left lateral wedge.  There, Dr. Malen Gauze slowly and carefully augments her epidural to a surgical level.  After testing, she has felt to have an adequate surgical level of anesthesia.  She was then prepped.  Foley catheters were already in place, and she was draped in a sterile fashion.  A time-out was then undertaken.  The patient was tested with an Allis clamp in multiple locations and has no reaction.  While palpating the pubic bone for the location of the incision, the patient states she is alarmed and she can feel pain.  Therefore, 20 mL of 0.25% Marcaine were injected along the area for the Pfannenstiel incision. Incision was then made and carried down to the level approaching the fascia, and the patient again reacts.  While waiting about 2-3 minutes, Dr. Malen Gauze augmented the surgical level.  We then progressed without difficulty.  The fascia was scored bilaterally and then extended cephalad laterally.  The peritoneum was entered and taken down superiorly and inferiorly.  Vesicouterine peritoneum was taken down cephalad-laterally, and the bladder flap was developed and bladder blade was placed.  Uterus was then incised in a low transverse manner.  The uterine cavity was  entered bluntly with a hemostat.  The uterine incision was then extended cephalad-laterally with fingers.  The baby A which was lowest in the pelvis was delivered first from the vertex position.  Good cry and tone was noted.  Cord was clamped and cut.  The baby was handed to awaiting pediatrics team.  Baby B was then delivered after rupturing for clear fluid.  Again good cry and tone was noted. Cord was clamped and cut.  The baby was handed to awaiting pediatrics team.  Cords were marked separately.  The placentas were then delivered and sent to Pathology.  Uterus was exteriorized.  Inspection revealed the cavity  to be clean.  Uterus was closed in two running locking imbricating layers of 0 Monocryl suture which achieves good hemostasis. Tubes and ovaries were normal bilaterally.  Uterus was then returned to the abdomen.  Irrigation was carried out.  Inspection revealed good hemostasis.  Anterior peritoneum was closed in a running fashion with 0 Monocryl suture which was also used to reapproximate the pyramidalis muscle in midline.  Anterior rectus fascia was closed in a running fashion with a 0 looped PDS.  Subcutaneous tissue was reapproximated with plain suture, and the skin was closed in a subcuticular fashion with 4-0 Vicryl on a Keith needle.  Benzoin and Steri-Strips, pressure dressing were applied.  All counts were correct.  The patient was taken to the recovery room in stable condition.     Guy SandiferJames E. Henderson Cloudomblin, M.D.     JET/MEDQ  D:  09/06/2015  T:  09/06/2015  Job:  [id number]

## 2015-09-06 NOTE — Anesthesia Postprocedure Evaluation (Signed)
Anesthesia Post Note  Patient: Jaclyn Jones  Procedure(s) Performed: Procedure(s) (LRB): CESAREAN SECTION (N/A)  Patient location during evaluation: PACU Anesthesia Type: Epidural Level of consciousness: oriented and awake and alert Pain management: pain level controlled Vital Signs Assessment: post-procedure vital signs reviewed and stable Respiratory status: spontaneous breathing, respiratory function stable and patient connected to nasal cannula oxygen Cardiovascular status: blood pressure returned to baseline and stable Postop Assessment: no headache, no backache and epidural receding Anesthetic complications: no     Last Vitals:  Filed Vitals:   09/06/15 2000 09/06/15 2015  BP: 117/73 115/80  Pulse: 90 96  Temp:  36.4 C  Resp: 18 20    Last Pain:  Filed Vitals:   09/06/15 2036  PainSc: 7    Pain Goal: Patients Stated Pain Goal: 4 (09/06/15 1600)               [place] Devine J

## 2015-09-06 NOTE — Progress Notes (Signed)
FHT cat one x 2 UCs q2-3 min Cervix no change in 2.5 hours A: Arrest of dilation P: D/W patient cesarean section and risks including infection, organ damage, bleeding/transfusion-HIV/Hep, DVT/PE, pneumonia. All questions answered. Patient states she understands and agrees.

## 2015-09-06 NOTE — Anesthesia Procedure Notes (Signed)
Epidural Patient location during procedure: OB Start time: 09/06/2015 2:48 PM  Staffing Anesthesiologist: Mal AmabileFOSTER, Michaelle Bottomley Performed by: anesthesiologist   Preanesthetic Checklist Completed: patient identified, site marked, surgical consent, pre-op evaluation, timeout performed, IV checked, risks and benefits discussed and monitors and equipment checked  Epidural Patient position: sitting Prep: site prepped and draped and DuraPrep Patient monitoring: continuous pulse ox and blood pressure Approach: midline Location: L3-L4 Injection technique: LOR air  Needle:  Needle type: Tuohy  Needle gauge: 17 G Needle length: 9 cm and 9 Needle insertion depth: 6 cm Catheter type: closed end flexible Catheter size: 19 Gauge Catheter at skin depth: 11 cm Test dose: negative and Other  Assessment Events: blood not aspirated, injection not painful, no injection resistance, negative IV test and no paresthesia  Additional Notes Patient identified. Risks and benefits discussed including failed block, incomplete  Pain control, post dural puncture headache, nerve damage, paralysis, blood pressure Changes, nausea, vomiting, reactions to medications-both toxic and allergic and post Partum back pain. All questions were answered. Patient expressed understanding and wished to proceed. Sterile technique was used throughout procedure. Epidural site was Dressed with sterile barrier dressing. No paresthesias, signs of intravascular injection Or signs of intrathecal spread were encountered.  Patient was more comfortable after the epidural was dosed. Please see RN's note for documentation of vital signs and FHR which are stable.

## 2015-09-06 NOTE — Progress Notes (Signed)
5/C/-1 FHT cat one x 2  D/W patient and husband pain relief options and possible need for version or emergent cesarean section for Baby B All questions answered, patient states she understands

## 2015-09-06 NOTE — Anesthesia Preprocedure Evaluation (Addendum)
Anesthesia Evaluation  Patient identified by MRN, date of birth, ID band Patient awake    Reviewed: Allergy & Precautions, Patient's Chart, lab work & pertinent test results  History of Anesthesia Complications (+) history of anesthetic complications  Airway Mallampati: III  TM Distance: >3 FB Neck ROM: Full    Dental no notable dental hx. (+) Teeth Intact   Pulmonary neg pulmonary ROS,    Pulmonary exam normal breath sounds clear to auscultation       Cardiovascular hypertension, Pt. on medications negative cardio ROS Normal cardiovascular exam Rhythm:Regular Rate:Normal     Neuro/Psych PSYCHIATRIC DISORDERS Anxiety Depression Bilateral CTS  Neuromuscular disease    GI/Hepatic Neg liver ROS, GERD  Medicated,  Endo/Other  Morbid obesity  Renal/GU negative Renal ROS  negative genitourinary   Musculoskeletal negative musculoskeletal ROS (+)   Abdominal (+) + obese,   Peds  Hematology  (+) anemia ,   Anesthesia Other Findings   Reproductive/Obstetrics Twin Gestation 37 2/7  weeks Borderline Polyhdramnios Pre eclampsia- on MgSO4                             Anesthesia Physical  Anesthesia Plan  ASA: III and emergent  Anesthesia Plan: Epidural   Post-op Pain Management:    Induction:   Airway Management Planned: Natural Airway  Additional Equipment:   Intra-op Plan:   Post-operative Plan:   Informed Consent:   Dental advisory given  Plan Discussed with: Anesthesiologist, CRNA and Surgeon  Anesthesia Plan Comments: (Patient being induced for elevated BP's. She currently has a HA and visual disturbance which she describes as blurring of her vision. Will give IV Fentanyl as I feel her reaction to epidural last time was not related to an "allergy" per se, but a response to the epidural placement and dosage. Patient for C/Section. Will use epidural for C/Section. )       Anesthesia Quick Evaluation

## 2015-09-06 NOTE — Transfer of Care (Signed)
Immediate Anesthesia Transfer of Care Note  Patient: Jaclyn Jones  Procedure(s) Performed: Procedure(s): CESAREAN SECTION (N/A)  Patient Location: PACU  Anesthesia Type:Epidural  Level of Consciousness: awake, alert  and oriented  Airway & Oxygen Therapy: Patient Spontanous Breathing  Post-op Assessment: Report given to RN and Post -op Vital signs reviewed and stable  Post vital signs: Reviewed and stable  Last Vitals:  Filed Vitals:   09/06/15 1731 09/06/15 1811  BP: 121/68 137/91  Pulse: 104 113  Temp:    Resp:      Last Pain:  Filed Vitals:   09/06/15 1811  PainSc: 3       Patients Stated Pain Goal: 4 (09/06/15 1600)  Complications: No apparent anesthesia complications

## 2015-09-06 NOTE — Anesthesia Pain Management Evaluation Note (Signed)
  CRNA Pain Management Visit Note  Patient: Jaclyn Jones, 30 y.o., female  "Hello I am a member of the anesthesia team at [facility]. We have an anesthesia team available at all times to provide care throughout the hospital, including epidural management and anesthesia for C-section. I don't know your plan for the delivery whether it a natural birth, water birth, IV sedation, nitrous supplementation, doula or epidural, but we want to meet your pain goals."   1.Was your pain managed to your expectations on prior hospitalizations?   No   2.What is your expectation for pain management during this hospitalization?     Epidural  3.How can we help you reach that goal? epidural  Record the patient's initial score and the patient's pain goal.   Pain: 7  Pain Goal: 4 [facility] wants you to be able to say your pain was always managed very well.  Cephus ShellingBURGER,Johney Perotti 09/06/2015

## 2015-09-07 ENCOUNTER — Encounter (HOSPITAL_COMMUNITY): Payer: Self-pay | Admitting: Obstetrics and Gynecology

## 2015-09-07 LAB — COMPREHENSIVE METABOLIC PANEL
ALK PHOS: 134 U/L — AB (ref 38–126)
ALT: 14 U/L (ref 14–54)
AST: 23 U/L (ref 15–41)
Albumin: 2.5 g/dL — ABNORMAL LOW (ref 3.5–5.0)
Anion gap: 6 (ref 5–15)
BILIRUBIN TOTAL: 0.4 mg/dL (ref 0.3–1.2)
CALCIUM: 7.2 mg/dL — AB (ref 8.9–10.3)
CO2: 26 mmol/L (ref 22–32)
CREATININE: 0.71 mg/dL (ref 0.44–1.00)
Chloride: 104 mmol/L (ref 101–111)
Glucose, Bld: 107 mg/dL — ABNORMAL HIGH (ref 65–99)
Potassium: 3.6 mmol/L (ref 3.5–5.1)
Sodium: 136 mmol/L (ref 135–145)
Total Protein: 5 g/dL — ABNORMAL LOW (ref 6.5–8.1)

## 2015-09-07 LAB — CBC
HCT: 26.9 % — ABNORMAL LOW (ref 36.0–46.0)
HCT: 26.3 % — ABNORMAL LOW (ref 36.0–46.0)
HEMOGLOBIN: 8.8 g/dL — AB (ref 12.0–15.0)
Hemoglobin: 8.6 g/dL — ABNORMAL LOW (ref 12.0–15.0)
MCH: 26.7 pg (ref 26.0–34.0)
MCH: 27 pg (ref 26.0–34.0)
MCHC: 32.7 g/dL (ref 30.0–36.0)
MCHC: 32.7 g/dL (ref 30.0–36.0)
MCV: 81.8 fL (ref 78.0–100.0)
MCV: 82.4 fL (ref 78.0–100.0)
PLATELETS: 182 10*3/uL (ref 150–400)
PLATELETS: 175 10*3/uL (ref 150–400)
RBC: 3.29 MIL/uL — AB (ref 3.87–5.11)
RBC: 3.19 MIL/uL — AB (ref 3.87–5.11)
RDW: 16 % — ABNORMAL HIGH (ref 11.5–15.5)
RDW: 16 % — AB (ref 11.5–15.5)
WBC: 15.3 10*3/uL — AB (ref 4.0–10.5)
WBC: 13.7 10*3/uL — AB (ref 4.0–10.5)

## 2015-09-07 MED ORDER — DEXTROSE IN LACTATED RINGERS 5 % IV SOLN
INTRAVENOUS | Status: DC
  Administered 2015-09-07: 10:00:00 via INTRAVENOUS

## 2015-09-07 MED ORDER — MAGNESIUM SULFATE 50 % IJ SOLN
1.5000 g/h | INTRAVENOUS | Status: DC
  Filled 2015-09-07: qty 80

## 2015-09-07 MED ORDER — RHO D IMMUNE GLOBULIN 1500 UNIT/2ML IJ SOSY
300.0000 ug | PREFILLED_SYRINGE | Freq: Once | INTRAMUSCULAR | Status: AC
  Administered 2015-09-07: 300 ug via INTRAVENOUS
  Filled 2015-09-07: qty 2

## 2015-09-07 NOTE — Clinical Social Work Maternal (Signed)
  CLINICAL SOCIAL WORK MATERNAL/CHILD NOTE  Patient Details  Name: Jaclyn Jones MRN: [id number] Date of Birth: [birth year]  Date:  09/07/2015  Clinical Social Worker Initiating Note:  Laurey Arrow Date/ Time Initiated:  09/07/15/1610     Child's Name:      Legal Guardian:  Mother   Need for Interpreter:  None   Date of Referral:  09/07/15     Reason for Referral:  Behavioral Health Issues, including SI    Referral Source:  Central Nursery   Address:  [address] [place] [address]  Phone number:      Household Members:  Self, Spouse, Minor Children   Natural Supports (not living in the home):  Extended Family, Friends, Immediate Family, Artist Supports: None   Employment: Homemaker   Type of Work:     Education:      Pensions consultant:  Multimedia programmer   Other Resources:      Cultural/Religious Considerations Which May Impact Care:  non reported  Strengths:  Ability to meet basic needs , Home prepared for child , Pediatrician chosen    Risk Factors/Current Problems:  Mental Health Concerns    Cognitive State:  Insightful , Linear Thinking    Mood/Affect:  Calm , Comfortable , Flat    CSW Assessment: CSW met with mother for a consult for hx of anxiety, depression, and PPD. CSW also completed an assessment, and offered MOB community supports. MOB was inviting, polite, and was engaged during the visit.  MOB introduced her room visitor as her husband Shanon Brow), and gave CSW permission to meet with her while FOB was present. FOB was appropriate and was attentive to twins during the session. CSW inquired about MOB supports and living situation.  MOB communicated that she and FOB resides together along with their 66 year old daughter, June.  MOB stated that she has the support of her husband, in-laws, parents, and friends. Both MOB and FOB communicated they feel like they are well prepared for the twins. CSW educated MOB and FOB  about PPD.  CSW informed MOB and FOB of possible supports and interventions to decrease PPD.  CSW also encouraged MOB to seek medical attention if needed for increased signs and symptoms for PPD. MOB communicated that she has therapist that she utilized when she experienced PDD with her first child, and knows how to get in touch with her if warranted. MOB declined resources for [facility] outpatient services.  CSW also reviewed safe sleep, and SIDS. MOB and FOB were knowledgeable.  MOB communicated that she has cribs for the twins, and actively listen to CSW suggestions to reduced SIDS.  CSW thanked MOB and FOB for their time and both parents communicated that they did not have any further questions, concerns, or needs at this time.  CSW Plan/Description:  No Further Intervention Required/No Barriers to Discharge    Geralyn Figiel D BOYD-GILYARD, LCSW 09/07/2015, 4:23 PM

## 2015-09-07 NOTE — Progress Notes (Signed)
Attempted initial visit with Jaclyn Jones to introduce spiritual care services and offer support.  Pt's mother stated she was feeding the baby at this time.  Please page as further needs arise.  Maryanna ShapeAmanda M. Carley Hammedavee Lomax, M.Div. [facility] Chaplain Pager [phone] Office [phone]

## 2015-09-07 NOTE — Progress Notes (Signed)
S:  Patient is doing well. No complaints.  Still feels dizzy at times.    O: BP 103/57 mmHg  Pulse 85  Temp(Src) 99.1 F (37.3 C) (Oral)  Resp 18  Ht 5\' 4"  (1.626 m)  Wt 113.853 kg (251 lb)  BMI 43.06 kg/m2  SpO2 96%  Breastfeeding? Unknown Results for orders placed or performed during the hospital encounter of 09/05/15 (from the past 24 hour(s))  CBC     Status: Abnormal   Collection Time: 09/07/15  1:32 AM  Result Value Ref Range   WBC 15.3 (H) 4.0 - 10.5 K/uL   RBC 3.29 (L) 3.87 - 5.11 MIL/uL   Hemoglobin 8.8 (L) 12.0 - 15.0 g/dL   HCT 62.926.9 (L) 52.836.0 - 41.346.0 %   MCV 81.8 78.0 - 100.0 fL   MCH 26.7 26.0 - 34.0 pg   MCHC 32.7 30.0 - 36.0 g/dL   RDW 24.416.0 (H) 01.011.5 - 27.215.5 %   Platelets 182 150 - 400 K/uL  Comprehensive metabolic panel     Status: Abnormal   Collection Time: 09/07/15  6:48 AM  Result Value Ref Range   Sodium 136 135 - 145 mmol/L   Potassium 3.6 3.5 - 5.1 mmol/L   Chloride 104 101 - 111 mmol/L   CO2 26 22 - 32 mmol/L   Glucose, Bld 107 (H) 65 - 99 mg/dL   BUN <5 (L) 6 - 20 mg/dL   Creatinine, Ser 5.360.71 0.44 - 1.00 mg/dL   Calcium 7.2 (L) 8.9 - 10.3 mg/dL   Total Protein 5.0 (L) 6.5 - 8.1 g/dL   Albumin 2.5 (L) 3.5 - 5.0 g/dL   AST 23 15 - 41 U/L   ALT 14 14 - 54 U/L   Alkaline Phosphatase 134 (H) 38 - 126 U/L   Total Bilirubin 0.4 0.3 - 1.2 mg/dL   GFR calc non Af Amer >60 >60 mL/min   GFR calc Af Amer >60 >60 mL/min   Anion gap 6 5 - 15  CBC     Status: Abnormal   Collection Time: 09/07/15  6:48 AM  Result Value Ref Range   WBC 13.7 (H) 4.0 - 10.5 K/uL   RBC 3.19 (L) 3.87 - 5.11 MIL/uL   Hemoglobin 8.6 (L) 12.0 - 15.0 g/dL   HCT 64.426.3 (L) 03.436.0 - 74.246.0 %   MCV 82.4 78.0 - 100.0 fL   MCH 27.0 26.0 - 34.0 pg   MCHC 32.7 30.0 - 36.0 g/dL   RDW 59.516.0 (H) 63.811.5 - 75.615.5 %   Platelets 175 150 - 400 K/uL   Abdomen is soft and non tender Bandages removed - clean and dry and intact  IMPRESSION: POD #1 Doing well Dizziness may be secondary to magnesium -  will discontinue now Ambulate prn Remove foley She does not desires circ for babies

## 2015-09-07 NOTE — Anesthesia Postprocedure Evaluation (Signed)
Anesthesia Post Note  Patient: Jaclyn Jones  Procedure(s) Performed: Procedure(s) (LRB): CESAREAN SECTION (N/A)  Patient location during evaluation: [facility] Anesthesia Type: Epidural Level of consciousness: awake and alert Pain management: pain level controlled Vital Signs Assessment: post-procedure vital signs reviewed and stable Respiratory status: spontaneous breathing, nonlabored ventilation and respiratory function stable Cardiovascular status: stable Postop Assessment: no headache, no backache and epidural receding Anesthetic complications: no     Last Vitals:  Filed Vitals:   09/07/15 0500 09/07/15 0655  BP: 90/56 103/57  Pulse: 85 85  Temp:  37.3 C  Resp: 20 18    Last Pain:  Filed Vitals:   09/07/15 0727  PainSc: 0-No pain   Pain Goal: Patients Stated Pain Goal: 4 (09/06/15 1600)               Junious SilkGILBERT,Deannie Resetar

## 2015-09-07 NOTE — Progress Notes (Addendum)
Patient had near syncope episode at the said time, with the BP of 93/68  When she was assisted to get changed in the bathroom. She was quickly assisted back to bed with a stedy lift.with the help of the other staff. BP recheck and it was 90/56, MD on call ( Dr. Henderson Cloudomblin) made aware and gave orders to keep pt on bedrest for the day, decrease magnesium dosage from 2g to 1.5g,  keep scds on and ok to leave foley in. Orders carried out as ordered. Will keep monitoring.

## 2015-09-07 NOTE — Lactation Note (Signed)
This note was copied from a baby's chart. Lactation Consultation Note  Initial visit made.  Twin boys are now 4516 hours old and per mom both have latched to breast a few times.  Mom states their plan is to both breast feed and supplement with formula as needed.  Reviewed supply and demand with mom and offered initiating a DEBP.  Mom states she prefers to use manual pump for now.  She is obtaining drops of colostrum which she gives back to babies.  Offered latch assist but mom states she recently had babies to breast and will call if assist needed.  Reviewed late preterm newborn behaviors.  Patient Name: Boy Joanell Risingara Maes EAVWU'JToday's Date: 09/07/2015 Reason for consult: Initial assessment;Multiple gestation;Late preterm infant   Maternal Data    Feeding Feeding Type: Breast Fed  LATCH Score/Interventions Latch: Repeated attempts needed to sustain latch, nipple held in mouth throughout feeding, stimulation needed to elicit sucking reflex. Intervention(s): Adjust position  Audible Swallowing: A few with stimulation Intervention(s): Hand expression  Type of Nipple: Everted at rest and after stimulation  Comfort (Breast/Nipple): Soft / non-tender     Hold (Positioning): Assistance needed to correctly position infant at breast and maintain latch.  LATCH Score: 7  Lactation Tools Discussed/Used     Consult Status Consult Status: Follow-up Date: 09/08/15 Follow-up type: In-patient    Huston FoleyMOULDEN, Loda Bialas S 09/07/2015, 11:27 AM

## 2015-09-08 LAB — RH IG WORKUP (INCLUDES ABO/RH)
ABO/RH(D): O NEG
FETAL SCREEN: NEGATIVE
GESTATIONAL AGE(WKS): 37
Unit division: 0

## 2015-09-08 MED ORDER — BUTALBITAL-APAP-CAFFEINE 50-325-40 MG PO TABS
1.0000 | ORAL_TABLET | ORAL | Status: DC | PRN
  Administered 2015-09-08: 1 via ORAL
  Filled 2015-09-08: qty 1

## 2015-09-08 MED ORDER — OXYCODONE HCL 10 MG PO TABS: 10.0000 mg | ORAL_TABLET | ORAL | Status: DC | PRN

## 2015-09-08 MED ORDER — IBUPROFEN 600 MG PO TABS: 600.0000 mg | ORAL_TABLET | Freq: Four times a day (QID) | ORAL | Status: DC

## 2015-09-08 NOTE — Lactation Note (Signed)
This note was copied from a baby's chart. Lactation Consultation Note  Patient Name: Boy Joanell Risingara Terrill ZOXWR'UToday's Date: 09/08/2015 Reason for consult: Follow-up assessment;Multiple gestation   With this mom of twins, now 5739 hours old and 37 3/7 weeks CGA. Mom is mostly formula feeding. She plans to begin using her DEP once home. She is doingome breastfeeding with the babies, but said due to C-section and other "issues", she is not breast feeding much at this time. Mom is only using a hand pump. She allowed me to examine her breasts. She does have easily expressed colostrum/transitional milk. I offered to assist her with latching the one baby that was due to feed.Mom di not pick up on my offer.  This baby was sound asleep at this time, so I suggested she call me if baby begins showing hunger cues. Mom did not want to set up an o/p lactation consult at this time, but knows she can call lactation for any questions/concerns, and to make an o/p consult, if desired.    Maternal Data    Feeding Feeding Type: Formula Nipple Type: Slow - flow  LATCH Score/Interventions                      Lactation Tools Discussed/Used     Consult Status Consult Status: Complete Follow-up type: Call as needed    Alfred LevinsLee, Ardyth Kelso Anne 09/08/2015, 9:59 AM

## 2015-09-08 NOTE — Progress Notes (Signed)
Assumed care from Lisa Scott, RN. 

## 2015-09-08 NOTE — Discharge Summary (Signed)
Obstetric Discharge Summary Reason for Admission: cesarean section Prenatal Procedures: none Intrapartum Procedures: cesarean: low cervical, transverse Postpartum Procedures: none Complications-Operative and Postpartum: none HEMOGLOBIN  Date Value Ref Range Status  09/07/2015 8.6* 12.0 - 15.0 g/dL Final   HCT  Date Value Ref Range Status  09/07/2015 26.3* 36.0 - 46.0 % Final    Physical Exam:  General: alert, cooperative, appears stated age and no distress Lochia: appropriate Uterine Fundus: firm Incision: healing well DVT Evaluation: No evidence of DVT seen on physical exam.  Discharge Diagnoses: Term Pregnancy-delivered, Preelampsia and twins  Discharge Information: Date: 09/08/2015 Activity: pelvic rest Diet: routine Medications: Ibuprofen and Percocet Condition: stable Instructions: refer to practice specific booklet Discharge to: home   Newborn Data:   Sallyanne KusterStarnes, Boy Brynlie [[id number]][[id number]]  Live born female  Birth Weight: 6 lb 7.7 oz (2940 g) APGAR: 9, 10   Randel BooksStarnes, BoyB Magaret [[id number]][[id number]]  Live born female  Birth Weight: 6 lb 11.6 oz (3050 g) APGAR: 8, 8  Home with mother.  Brinnley Lacap C 09/08/2015, 3:19 PM

## 2015-09-08 NOTE — Progress Notes (Signed)
Subjective: Postpartum Day 2: Cesarean Delivery Patient reports tolerating PO, + flatus and no problems voiding.   Reports frontal HA with out visual disturbance, seems worse with sitting position, Denies RUQ pain Denies prior h/o of migraine HA Objective: Vital signs in last 24 hours: Temp:  [98.3 F (36.8 C)-99.9 F (37.7 C)] 98.9 F (37.2 C) (06/02 0619) Pulse Rate:  [82-116] 86 (06/02 0619) Resp:  [18-20] 18 (06/02 0619) BP: (104-121)/(57-77) 112/59 mmHg (06/02 0619) SpO2:  [96 %-100 %] 100 % (06/02 0619) Weight:  [231 lb 0.1 oz (104.784 kg)] 231 lb 0.1 oz (104.784 kg) (06/02 16100619)  Physical Exam:  General: alert and cooperative Lochia: appropriate Uterine Fundus: firm Incision: healing well DVT Evaluation: No evidence of DVT seen on physical exam. Negative Homan's sign. No cords or calf tenderness. Calf/Ankle edema is present. DTR's 1-2+, no clonus   Recent Labs  09/07/15 0132 09/07/15 0648  HGB 8.8* 8.6*  HCT 26.9* 26.3*  Labs reviewed  Assessment/Plan: Status post Cesarean section. Postoperative course complicated by HA  Suggested caffeine beverage Fioricet prn.  CURTIS,CAROL G 09/08/2015, 8:48 AM

## 2015-09-09 LAB — TYPE AND SCREEN
ABO/RH(D): O NEG
ANTIBODY SCREEN: POSITIVE
DAT, IgG: NEGATIVE
UNIT DIVISION: 0
Unit division: 0

## 2015-09-11 ENCOUNTER — Encounter (HOSPITAL_COMMUNITY): Admission: RE | Admit: 2015-09-11 | Payer: BC Managed Care – PPO | Source: Ambulatory Visit

## 2015-09-12 ENCOUNTER — Inpatient Hospital Stay (HOSPITAL_COMMUNITY)
Admission: AD | Admit: 2015-09-12 | Payer: BC Managed Care – PPO | Source: Ambulatory Visit | Admitting: Obstetrics and Gynecology

## 2015-09-12 ENCOUNTER — Encounter (HOSPITAL_COMMUNITY): Admission: AD | Payer: Self-pay | Source: Ambulatory Visit

## 2015-09-12 SURGERY — Surgical Case
Anesthesia: Regional

## 2015-09-13 ENCOUNTER — Other Ambulatory Visit: Payer: BC Managed Care – PPO

## 2015-09-15 ENCOUNTER — Inpatient Hospital Stay (HOSPITAL_COMMUNITY): Admission: RE | Admit: 2015-09-15 | Payer: BC Managed Care – PPO | Source: Ambulatory Visit

## 2016-03-15 ENCOUNTER — Other Ambulatory Visit: Payer: Self-pay

## 2017-05-28 ENCOUNTER — Telehealth: Payer: BC Managed Care – PPO | Admitting: Family

## 2017-05-28 DIAGNOSIS — J208 Acute bronchitis due to other specified organisms: Secondary | ICD-10-CM

## 2017-05-28 DIAGNOSIS — B9689 Other specified bacterial agents as the cause of diseases classified elsewhere: Secondary | ICD-10-CM

## 2017-05-28 MED ORDER — BENZONATATE 100 MG PO CAPS: 100.0000 mg | ORAL_CAPSULE | Freq: Three times a day (TID) | ORAL | 0 refills | Status: DC | PRN

## 2017-05-28 MED ORDER — DOXYCYCLINE HYCLATE 100 MG PO TABS: 100.0000 mg | ORAL_TABLET | Freq: Two times a day (BID) | ORAL | 0 refills | Status: DC

## 2017-05-28 NOTE — Progress Notes (Signed)
We are sorry that you are not feeling well.  Here is how we plan to help!  Based on your presentation I believe you most likely have A cough due to bacteria.  When patients have a fever and a productive cough with a change in color or increased sputum production, we are concerned about bacterial bronchitis.  If left untreated it can progress to pneumonia.  If your symptoms do not improve with your treatment plan it is important that you contact your provider.   I have prescribed Doxycycline 100 mg twice a day for 7 days     In addition you may use A non-prescription cough medication called Robitussin DAC. Take 2 teaspoons every 8 hours or Delsym: take 2 teaspoons every 12 hours., A non-prescription cough medication called Mucinex DM: take 2 tablets every 12 hours. and A prescription cough medication called Tessalon Perles 100mg. You may take 1-2 capsules every 8 hours as needed for your cough.    From your responses in the eVisit questionnaire you describe inflammation in the upper respiratory tract which is causing a significant cough.  This is commonly called Bronchitis and has four common causes:    Allergies  Viral Infections  Acid Reflux  Bacterial Infection Allergies, viruses and acid reflux are treated by controlling symptoms or eliminating the cause. An example might be a cough caused by taking certain blood pressure medications. You stop the cough by changing the medication. Another example might be a cough caused by acid reflux. Controlling the reflux helps control the cough.  USE OF BRONCHODILATOR ("RESCUE") INHALERS: There is a risk from using your bronchodilator too frequently.  The risk is that over-reliance on a medication which only relaxes the muscles surrounding the breathing tubes can reduce the effectiveness of medications prescribed to reduce swelling and congestion of the tubes themselves.  Although you feel brief relief from the bronchodilator inhaler, your asthma may  actually be worsening with the tubes becoming more swollen and filled with mucus.  This can delay other crucial treatments, such as oral steroid medications. If you need to use a bronchodilator inhaler daily, several times per day, you should discuss this with your provider.  There are probably better treatments that could be used to keep your asthma under control.     HOME CARE . Only take medications as instructed by your medical team. . Complete the entire course of an antibiotic. . Drink plenty of fluids and get plenty of rest. . Avoid close contacts especially the very young and the elderly . Cover your mouth if you cough or cough into your sleeve. . Always remember to wash your hands . A steam or ultrasonic humidifier can help congestion.   GET HELP RIGHT AWAY IF: . You develop worsening fever. . You become short of breath . You cough up blood. . Your symptoms persist after you have completed your treatment plan MAKE SURE YOU   Understand these instructions.  Will watch your condition.  Will get help right away if you are not doing well or get worse.  Your e-visit answers were reviewed by a board certified advanced clinical practitioner to complete your personal care plan.  Depending on the condition, your plan could have included both over the counter or prescription medications. If there is a problem please reply  once you have received a response from your provider. Your safety is important to us.  If you have drug allergies check your prescription carefully.    You can use   MyChart to ask questions about today's visit, request a non-urgent call back, or ask for a work or school excuse for 24 hours related to this e-Visit. If it has been greater than 24 hours you will need to follow up with your provider, or enter a new e-Visit to address those concerns. You will get an e-mail in the next two days asking about your experience.  I hope that your e-visit has been valuable and will  speed your recovery. Thank you for using e-visits.   

## 2017-08-18 ENCOUNTER — Telehealth: Payer: BC Managed Care – PPO | Admitting: Family Medicine

## 2017-08-18 DIAGNOSIS — J329 Chronic sinusitis, unspecified: Secondary | ICD-10-CM

## 2017-08-18 MED ORDER — AMOXICILLIN-POT CLAVULANATE 875-125 MG PO TABS: 1.0000 | ORAL_TABLET | Freq: Two times a day (BID) | ORAL | 0 refills | Status: DC

## 2017-08-18 NOTE — Progress Notes (Signed)
We are sorry that you are not feeling well.  Here is how we plan to help!  Based on what you have shared with me it looks like you have sinusitis.  Sinusitis is inflammation and infection in the sinus cavities of the head.  Based on your presentation I believe you most likely have Acute Bacterial Sinusitis.  This is an infection caused by bacteria and is treated with antibiotics. I have prescribed Augmentin /125mg  one tablet twice daily with food, for 7 days. You may use an oral decongestant such as Mucinex D or if you have glaucoma or high blood pressure use plain Mucinex. Saline nasal spray help and can safely be used as often as needed for congestion.  If you develop worsening sinus pain, fever or notice severe headache and vision changes, or if symptoms are not better after completion of antibiotic, please schedule an appointment with a health care provider.    THIS MEDICATION WILL ALSO TREAT AN EAR INFECTION IF YOU ACTUALLY HAVE ONE. THANKS.  Sinus infections are not as easily transmitted as other respiratory infection, however we still recommend that you avoid close contact with loved ones, especially the very young and elderly.  Remember to wash your hands thoroughly throughout the day as this is the number one way to prevent the spread of infection!  Home Care:  Only take medications as instructed by your medical team.  Complete the entire course of an antibiotic.  Do not take these medications with alcohol.  A steam or ultrasonic humidifier can help congestion.  You can place a towel over your head and breathe in the steam from hot water coming from a faucet.  Avoid close contacts especially the very young and the elderly.  Cover your mouth when you cough or sneeze.  Always remember to wash your hands.  Get Help Right Away If:  You develop worsening fever or sinus pain.  You develop a severe head ache or visual changes.  Your symptoms persist after you have completed your  treatment plan.  Make sure you  Understand these instructions.  Will watch your condition.  Will get help right away if you are not doing well or get worse.  Your e-visit answers were reviewed by a board certified advanced clinical practitioner to complete your personal care plan.  Depending on the condition, your plan could have included both over the counter or prescription medications.  If there is a problem please reply  once you have received a response from your provider.  Your safety is important to [place].  If you have drug allergies check your prescription carefully.    You can use MyChart to ask questions about today's visit, request a non-urgent call back, or ask for a work or school excuse for 24 hours related to this e-Visit. If it has been greater than 24 hours you will need to follow up with your provider, or enter a new e-Visit to address those concerns.  You will get an e-mail in the next two days asking about your experience.  I hope that your e-visit has been valuable and will speed your recovery. Thank you for using e-visits.

## 2018-10-21 ENCOUNTER — Other Ambulatory Visit: Payer: Self-pay | Admitting: Internal Medicine

## 2018-10-21 DIAGNOSIS — Z20822 Contact with and (suspected) exposure to covid-19: Secondary | ICD-10-CM

## 2018-10-25 LAB — NOVEL CORONAVIRUS, NAA: SARS-CoV-2, NAA: NOT DETECTED

## 2019-02-15 ENCOUNTER — Other Ambulatory Visit: Payer: Self-pay

## 2019-07-08 NOTE — Progress Notes (Signed)
Patient referred by Merrilee Seashore, MD for palpitations  Subjective:   Jaclyn Jones, female    DOB: 02/23/1986, 34 y.o.   MRN: [id number]   Chief Complaint  Patient presents with  . Palpitations  . Abnormal ECG  . New Patient (Initial Visit)     HPI  34 y.o. Caucasian female with panic attacks, palpitations. Mobitz type 1 AV block   Patient has h/o Panic attacks, which have been worse since Dec 2020. Episodes last for 45 min, include "rush of blood", hands feeling cold, palpitations. She wore event monitor through PCP, details below. In the last few weeks, she has started exercising regularly. She does report chest pain when her HR is 170 bpm. However, it does not limit her physical exercise.   Patient used to drink 3-5 coffee cups/day, alcohol couple drinks a week. She has not cut down on this. Also, she has reduced her Marijuana use. She is currently on celexa for her panic attacks.   Past Medical History:  Diagnosis Date  . Anxiety   . Carpal tunnel syndrome   . Complication of anesthesia    Low BP  . Depression    PP anxiety and depression no meds  . GERD (gastroesophageal reflux disease)   . Hx of varicella   . Vaginal Pap smear, abnormal    bx was normal     Past Surgical History:  Procedure Laterality Date  . ADENOIDECTOMY AND MYRINGOTOMY WITH TUBE PLACEMENT    . CESAREAN SECTION N/A 09/06/2015   Procedure: CESAREAN SECTION;  Surgeon: Everlene Farrier, MD;  Location: [facility];  Service: Obstetrics;  Laterality: N/A;  . TONSILLECTOMY    . WISDOM TOOTH EXTRACTION       Social History   Tobacco Use  Smoking Status Never Smoker  Smokeless Tobacco Never Used    Social History   Substance and Sexual Activity  Alcohol Use Yes   Comment: occ     Family History  Problem Relation Age of Onset  . Cancer Maternal Grandmother        lung, smoker  . Diabetes Paternal Grandfather   . Hypertension Paternal Grandfather   . Cancer Paternal  Grandfather   . Alcohol abuse Maternal Grandfather   . Rheum arthritis Paternal Grandmother   . Healthy Mother   . Healthy Father   . Healthy Brother   . Hearing loss Neg Hx      Current Outpatient Medications on File Prior to Visit  Medication Sig Dispense Refill  . acetaminophen (TYLENOL) 500 MG tablet Take 500 mg by mouth every 6 (six) hours as needed for moderate pain.     . citalopram (CELEXA) 10 MG tablet Take 20 mg by mouth daily.    . hydrOXYzine (ATARAX/VISTARIL) 25 MG tablet Take 25 mg by mouth 3 (three) times daily as needed.    . IRON PO Take 1 tablet by mouth daily.    . Multiple Vitamins-Minerals (MULTIVITAMIN WITH MINERALS) tablet Take 1 tablet by mouth daily.    . Omega-3 Fatty Acids (FISH OIL) 1200 MG CPDR Take 1 capsule by mouth daily.    . pantoprazole (PROTONIX) 40 MG tablet Take 40 mg by mouth daily.     No current facility-administered medications on file prior to visit.    Cardiovascular and other pertinent studies:  EKG 07/12/2019: Sinus rhythm  80 bpm. Normal EKG.   Event monitor 05/07/2019 - 05/21/2019: Dominant rhythm: Sinus. HR 29-174 bpm. Avg HR 83 bpm. Sinus bradycardia  29 bpm, and Mobitz type 1 AV block seen during sleep hours. Rare PAC/PVC seen. Episodes of flutter/skipped beats do not correlate with arrhythmia.  No atrial fibrillation/atrial flutter/SVT/VT/high grade AV block, sinus pause >3sec noted.  Recent labs: 06/07/2019: Glucose 89, BUN/Cr 17/0.9. EGFR 80.83. Na/K 142/4.4. Rest of the CMP normal H/H 12.3/38.2. MCV 87.0. Platelets 232 Chol 173, TG 103, HDL 43, LDL 109 TSH 1.83 normal   Review of Systems  Cardiovascular: Positive for palpitations. Negative for chest pain, dyspnea on exertion, leg swelling and syncope.  Psychiatric/Behavioral: The patient is nervous/anxious.        Panic attacks         Vitals:   07/12/19 1419  BP: 135/85  Pulse: 89  Resp: 18  Temp: 98.5 F (36.9 C)  SpO2: 99%     Body mass index is  39.65 kg/m. Filed Weights   07/12/19 1419  Weight: 231 lb (104.8 kg)     Objective:   Physical Exam  Constitutional: She appears well-developed and well-nourished.  Neck: No JVD present.  Cardiovascular: Normal rate, regular rhythm, normal heart sounds and intact distal pulses.  No murmur heard. Pulmonary/Chest: Effort normal and breath sounds normal. She has no wheezes. She has no rales.  Musculoskeletal:        General: No edema.  Nursing note and vitals reviewed.       Assessment & Recommendations:   34 y.o. Caucasian female with Mobitz type 1 AV block  Findings of Mobitz type 1 AV block and sinus bradycardia are during sleep hours. They suggest high vagal tone. This could be related to physical activity, or more likely due to OSA. Defer workup to Dr. Pearlie Oyster office. I will obtain echocardiogram and exercise treadmill stress test to evaluate for any structural abnormalities and exercise induced ischemia.  I will see her on as needed basis, unless any significant abnormalities found on the above workup.    Nigel Mormon, MD [facility] Pager: [phone] Office: [phone]

## 2019-07-12 ENCOUNTER — Other Ambulatory Visit: Payer: Self-pay

## 2019-07-12 ENCOUNTER — Ambulatory Visit: Payer: BC Managed Care – PPO | Admitting: Cardiology

## 2019-07-12 ENCOUNTER — Encounter: Payer: Self-pay | Admitting: Cardiology

## 2019-07-12 VITALS — BP 135/85 | HR 89 | Temp 98.5°F | Resp 18 | Ht 64.0 in | Wt 231.0 lb

## 2019-07-12 DIAGNOSIS — R079 Chest pain, unspecified: Secondary | ICD-10-CM | POA: Insufficient documentation

## 2019-07-12 DIAGNOSIS — I441 Atrioventricular block, second degree: Secondary | ICD-10-CM

## 2019-07-15 ENCOUNTER — Other Ambulatory Visit: Payer: Self-pay | Admitting: Cardiology

## 2019-07-15 DIAGNOSIS — R0683 Snoring: Secondary | ICD-10-CM

## 2019-07-23 ENCOUNTER — Inpatient Hospital Stay (HOSPITAL_COMMUNITY): Admission: RE | Admit: 2019-07-23 | Payer: Self-pay | Source: Ambulatory Visit

## 2019-07-25 ENCOUNTER — Telehealth: Payer: Self-pay | Admitting: Cardiology

## 2019-07-25 NOTE — Telephone Encounter (Signed)
GXT may need to be rescheduled due to patient forgetting to get Covid-19 screening.   ST

## 2019-07-26 ENCOUNTER — Other Ambulatory Visit: Payer: BC Managed Care – PPO

## 2019-07-27 ENCOUNTER — Other Ambulatory Visit: Payer: Self-pay

## 2019-07-27 ENCOUNTER — Encounter: Payer: Self-pay | Admitting: Neurology

## 2019-07-27 ENCOUNTER — Ambulatory Visit: Payer: BC Managed Care – PPO | Admitting: Neurology

## 2019-07-27 VITALS — BP 126/81 | HR 77 | Temp 97.1°F | Ht 64.0 in | Wt 226.0 lb

## 2019-07-27 DIAGNOSIS — R002 Palpitations: Secondary | ICD-10-CM | POA: Insufficient documentation

## 2019-07-27 DIAGNOSIS — F419 Anxiety disorder, unspecified: Secondary | ICD-10-CM | POA: Diagnosis not present

## 2019-07-27 DIAGNOSIS — I441 Atrioventricular block, second degree: Secondary | ICD-10-CM

## 2019-07-27 DIAGNOSIS — R0689 Other abnormalities of breathing: Secondary | ICD-10-CM

## 2019-07-27 NOTE — Patient Instructions (Signed)

## 2019-07-27 NOTE — Progress Notes (Signed)
SLEEP MEDICINE CLINIC    Provider:  Melvyn Novas, MD  Primary Care Physician:  Georgianne Fick, MD [address] [place] [address]     Referring Provider: Dr. Melvenia Needles, Cardiology        Chief Complaint according to patient   Patient presents with:    . New Patient (Initial Visit)     pt alone, rm 11. presents today from her cardiologist due to she was diagnosed with mobitz type 1 av block. this was noted more at night time and they are wanting to see if OSA is cause. never had a SS, unsure if snores or has apnea.      HISTORY OF PRESENT ILLNESS:  Jaclyn Jones is a 34 year old  Caucasian female patient seen here as a referral on 07/27/2019. Chief concern according to patient :  I have been having panic attacks and stopped using marihuana, and have seen mood treatment center- but now diagnosed with Mobitz type 1 AV Block.     Jaclyn Jones is  a right-handed White or Caucasian female with a possible sleep disorder. She  has a past medical history of Anxiety, Carpal tunnel syndrome, Complication of anesthesia, Depression, GERD (gastroesophageal reflux disease), varicella, and Vaginal Pap smear, abnormal. seasonal allergies. Obesity at BMI 38.8. Anxiety related insomnia.   Sleep relevant medical history: night time panic attacks,  Not Night terrors, palpitations, diaphoresis. Family medical /sleep history: No other family member with OSA, insomnia, no sleep walkers.    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 She was a Runner, broadcasting/film/video- elementary school.  Pets are present, 2 dogs. Husband is working from home.  Tobacco use- marihuana .  ETOH use: none for month , Caffeine intake in form of Coffee( quit) Soda( quit) Tea ( quit) -no energy drinks. Regular exercise in form of cardiac exercises/ walking    Sleep habits are as follows:  The  patient's dinner time is between 6 PM. The patient goes to bed at 11-12 PM and struggles for about 1 hour to sleep- she continues to sleep for 3 hours, wakes for a first bathroom break.  The preferred sleep position is supine , with the support of 3-5 pillows. Dreams are reportedly  frequent/vivid  8 AM is the usual rise time. The patient wakes up with an alarm.  She reports not feeling refreshed or restored in AM, with symptoms such as dry mouth and residual fatigue. Naps are taken infrequently, she finds it difficult to fall asleep.   Review of Systems: Out of a complete 14 system review, the patient complains of only the following symptoms, and all other reviewed systems are negative.:  Fatigue, sleepiness , no witnessed snoring,but gasping . Anxious.   fragmented sleep, Insomnia - light sleep.    How likely are you to doze in the following situations: 0 = not likely, 1 = slight chance, 2 = moderate chance, 3 = high chance   Sitting and Reading? 3 Watching Television?1 Sitting inactive in a public place (theater or meeting)?0 As a passenger in a car for an hour without a break? 3 Lying down in the afternoon when circumstances permit?3 Sitting and talking to someone? Sitting quietly after lunch without alcohol?2 In a car, while stopped for a few minutes in traffic?1   Total = 13/ 24 points  FSS endorsed at n/a / 63 points.   Social History   Socioeconomic History  . Marital status: Married    Spouse name: Not on file  . Number of children: 3  . Years of education: Not on file  . Highest education level: Not on file  Occupational History  . Not on file  Tobacco Use  . Smoking status: Never Smoker  . Smokeless tobacco: Never Used  Substance and Sexual Activity  . Alcohol use: Yes    Comment: occ  . Drug use: Not Currently    Types: Marijuana  . Sexual activity: Yes  Other Topics Concern  . Not on file  Social History Narrative  . Not on file   Social Determinants of  Health   Financial Resource Strain:   . Difficulty of Paying Living Expenses:   Food Insecurity:   . Worried About Programme researcher, broadcasting/film/video in the Last Year:   . Barista in the Last Year:   Transportation Needs:   . Freight forwarder (Medical):   Marland Kitchen Lack of Transportation (Non-Medical):   Physical Activity:   . Days of Exercise per Week:   . Minutes of Exercise per Session:   Stress:   . Feeling of Stress :   Social Connections:   . Frequency of Communication with Friends and Family:   . Frequency of Social Gatherings with Friends and Family:   . Attends Religious Services:   . Active Member of Clubs or Organizations:   . Attends Banker Meetings:   Marland Kitchen Marital Status:     Family History  Problem Relation Age of Onset  . Cancer Maternal Grandmother        lung, smoker  . Diabetes Paternal Grandfather   . Hypertension Paternal Grandfather   . Cancer Paternal Grandfather   . Alcohol abuse Maternal Grandfather   . Rheum arthritis Paternal Grandmother   . Healthy Mother   . Healthy Father   . Healthy Brother   . Hearing loss Neg Hx     Past Medical History:  Diagnosis Date  . Anxiety   . Carpal tunnel syndrome   . Complication of anesthesia    Low BP  . Depression    PP anxiety and depression no meds  . GERD (gastroesophageal reflux disease)   . Hx of varicella   . Vaginal Pap smear, abnormal    bx was normal    Past Surgical History:  Procedure Laterality Date  . ADENOIDECTOMY AND MYRINGOTOMY WITH TUBE PLACEMENT    . CESAREAN SECTION N/A 09/06/2015   Procedure: CESAREAN SECTION;  Surgeon: Harold Hedge, MD;  Location: [facility];  Service: Obstetrics;  Laterality: N/A;  . TONSILLECTOMY    . WISDOM TOOTH EXTRACTION       Current Outpatient Medications on File Prior to Visit  Medication Sig Dispense Refill  . acetaminophen (TYLENOL) 500 MG tablet Take 500 mg by mouth every 6 (six) hours as needed for moderate pain.     . citalopram  (CELEXA) 10 MG tablet Take 20 mg by mouth daily.    . hydrOXYzine (ATARAX/VISTARIL) 25 MG tablet Take 25 mg by mouth 3 (three) times daily as needed.    . Multiple Vitamins-Minerals (MULTIVITAMIN WITH MINERALS) tablet Take 1 tablet by mouth daily.    . Omega-3 Fatty Acids (FISH OIL) 1200 MG CPDR Take 1 capsule by mouth daily.    . pantoprazole (PROTONIX) 40 MG tablet Take 40 mg by mouth daily.  No current facility-administered medications on file prior to visit.    Allergies  Allergen Reactions  . Fentanyl-Bupivacaine-Nacl     Pt's blood pressure dropped when on this medication    Physical exam:  Today's Vitals   07/27/19 0856  BP: 126/81  Pulse: 77  Temp: (!) 97.1 F (36.2 C)  Weight: 226 lb (102.5 kg)  Height: 5\' 4"  (1.626 m)   Body mass index is 38.79 kg/m.   Wt Readings from Last 3 Encounters:  07/27/19 226 lb (102.5 kg)  07/12/19 231 lb (104.8 kg)  09/08/15 231 lb 0.1 oz (104.8 kg)     Ht Readings from Last 3 Encounters:  07/27/19 5\' 4"  (1.626 m)  07/12/19 5\' 4"  (1.626 m)  09/05/15 5\' 4"  (1.626 m)      General: The patient is awake, alert and appears not in acute distress. The patient is well groomed. Head: Normocephalic, atraumatic. Neck is supple. Mallampati 2 - tonsils were removed. ,  neck circumference 14.5  inches . Nasal airflow patent.   Retrognathia is not seen.  Dental status: intact Cardiovascular:  Regular rate and cardiac rhythm by pulse,  without distended neck veins. Respiratory: Lungs are clear to auscultation.  Skin:  Without evidence of ankle edema, or rash. Trunk: The patient's posture is erect.   Neurologic exam : The patient is awake and alert, oriented to place and time.   Memory subjective described as intact.  Attention span & concentration ability appears normal.  Speech is fluent,  without  dysarthria, dysphonia or aphasia.  Mood and affect are appropriate.   Cranial nerves: no loss of smell or taste reported  Pupils are  equal and briskly reactive to light. Funduscopic exam deferred.   Extraocular movements in vertical and horizontal planes were intact and without nystagmus. No Diplopia. Visual fields by finger perimetry are intact. Hearing was intact to soft voice and finger rubbing.  Facial sensation intact to fine touch.  Facial motor strength is symmetric and tongue and uvula move midline.  Neck ROM : rotation, tilt and flexion extension were normal for age and shoulder shrug was symmetrical.    Motor exam:  Symmetric bulk, tone and ROM.   Normal tone without cog- wheeling, symmetric grip strength .   Sensory:  Fine touch, pinprick and vibration were  normal.  Proprioception tested in the upper extremities was normal.   Coordination: Rapid alternating movements in the fingers/Jones were of normal speed.  The Finger-to-nose maneuver was intact without evidence of ataxia, dysmetria or tremor.   Gait and station: Patient could rise unassisted from a seated position, walked without assistive device.  Stance is of normal width/ base and the patient turned with 3 steps, .  Toe and heel walk were deferred.  Deep tendon reflexes: in the  upper and lower extremities are symmetric and intact.  Babinski response was deferred.       After spending a total time of  35 minutes face to face and additional time for physical and neurologic examination, review of laboratory studies,  personal review of imaging studies, reports and results of other testing and review of referral information / records as far as provided in visit, I have established the following assessments:  1)  Anxiety and panic attacks have affected sleep, causing insomnia.  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    My Plan is to proceed with:  1) aTtended sleep study , for heart  arte and rhythm monitoring in all sleep stages- this cannot be done by [facility].  2) gasping for air may reflect sleep  apnea.     I would like to thank Dr Virgina Jock  and Merrilee Seashore, [address] [place],  [place] [address] for allowing me to meet with and to take care of this pleasant patient.   In short, Jaclyn Jones is presenting with gasping for air, un- refreshing sleep, no snoring,  And cardiac palptations.  I plan to follow up either personally or through our NP within 2-3 month.   CC: I will share my notes with PCP.  Electronically signed by: Larey Seat, MD 07/27/2019 9:16 AM  [facility] Board certified by The [employer] of Sleep Medicine and Diplomate of the [employer] of Sleep Medicine. Board certified In Neurology through the [place], Fellow of the [employer] of Neurology. Medical Director of [facility].

## 2019-07-30 ENCOUNTER — Ambulatory Visit: Payer: BC Managed Care – PPO

## 2019-07-30 ENCOUNTER — Other Ambulatory Visit: Payer: Self-pay

## 2019-07-30 ENCOUNTER — Other Ambulatory Visit (HOSPITAL_COMMUNITY)
Admission: RE | Admit: 2019-07-30 | Discharge: 2019-07-30 | Disposition: A | Discharge status: Home / Self Care | Payer: BC Managed Care – PPO | Source: Ambulatory Visit | Attending: Cardiology | Admitting: Cardiology

## 2019-07-30 DIAGNOSIS — I441 Atrioventricular block, second degree: Secondary | ICD-10-CM

## 2019-07-30 DIAGNOSIS — Z20822 Contact with and (suspected) exposure to covid-19: Secondary | ICD-10-CM | POA: Diagnosis not present

## 2019-07-30 DIAGNOSIS — Z01812 Encounter for preprocedural laboratory examination: Secondary | ICD-10-CM | POA: Insufficient documentation

## 2019-07-30 LAB — SARS CORONAVIRUS 2 (TAT 6-24 HRS): SARS Coronavirus 2: NEGATIVE

## 2019-08-02 ENCOUNTER — Other Ambulatory Visit: Payer: Self-pay

## 2019-08-02 ENCOUNTER — Ambulatory Visit: Payer: BC Managed Care – PPO

## 2019-08-02 DIAGNOSIS — R079 Chest pain, unspecified: Secondary | ICD-10-CM

## 2019-08-04 NOTE — Progress Notes (Signed)
Relayed information to patient. Patient voiced understanding.  

## 2019-08-26 ENCOUNTER — Other Ambulatory Visit: Payer: Self-pay

## 2019-08-26 ENCOUNTER — Ambulatory Visit (INDEPENDENT_AMBULATORY_CARE_PROVIDER_SITE_OTHER): Payer: BC Managed Care – PPO | Admitting: Neurology

## 2019-08-26 DIAGNOSIS — G4733 Obstructive sleep apnea (adult) (pediatric): Secondary | ICD-10-CM | POA: Diagnosis not present

## 2019-08-26 DIAGNOSIS — R002 Palpitations: Secondary | ICD-10-CM

## 2019-08-26 DIAGNOSIS — F419 Anxiety disorder, unspecified: Secondary | ICD-10-CM

## 2019-08-26 DIAGNOSIS — R0689 Other abnormalities of breathing: Secondary | ICD-10-CM

## 2019-08-26 DIAGNOSIS — I441 Atrioventricular block, second degree: Secondary | ICD-10-CM

## 2019-09-14 ENCOUNTER — Telehealth: Payer: Self-pay | Admitting: *Deleted

## 2019-09-14 NOTE — Progress Notes (Signed)
No evidence of organic sleep disorder, Insomnia. IMPRESSION:   No evidence of a physiological organic sleep disorder. No  evidence of heart rhythm abnormality, either.   Insomnia for non-organic reasons. Anxiety, being hypervigilant.    RECOMMENDATIONS:   1. Follow up in our sleep clinic is optional- if desired, please schedule with NP. Cognitive behavior  therapy is recommended.

## 2019-09-14 NOTE — Telephone Encounter (Signed)
Spoke with pt and discussed sleep study results from Dr. Vickey Huger. Pt aware of her recommendations of cognitive behavior therapy that f/u in sleep clinic is optional but happy to schedule with NP if she desires. Pt would like to think about it. She will call back if she decides to proceed. She verbalized appreciation for the call and her questions were answered.

## 2019-09-14 NOTE — Telephone Encounter (Signed)
-----   Message from Melvyn Novas, MD sent at 09/14/2019  4:43 PM EDT ----- No evidence of organic sleep disorder, Insomnia. IMPRESSION:   No evidence of a physiological organic sleep disorder. No  evidence of heart rhythm abnormality, either.   Insomnia for non-organic reasons. Anxiety, being hypervigilant.    RECOMMENDATIONS:   1. Follow up in our sleep clinic is optional- if desired, please schedule with NP. Cognitive behavior  therapy is recommended.

## 2019-09-14 NOTE — Procedures (Signed)
PATIENT'S NAME:  Jaclyn Jones, Jaclyn Jones DOB:      07/18/1985      MR#:    [id number]     DATE OF RECORDING: 08/26/2019 REFERRING M.D.:  Merrilee Seashore, MD Study Performed:   Baseline Polysomnogram HISTORY:  Jaclyn Jones is a 34 year-old Caucasian female patient seen here as a referral on 07/27/2019. 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    Jaclyn Jones is a right-handed Caucasian female with a medical history of Anxiety, Carpal tunnel syndrome, Complication of anesthesia, Depression, GERD (gastroesophageal reflux disease), varicella, and seasonal allergies. Obesity at BMI 38.8. Anxiety related insomnia.  Sleep relevant medical history: nighttime panic attacks- palpitations, diaphoresis.   The patient endorsed the Epworth Sleepiness Scale at 13 points.   The patient's weight 227 pounds with a height of 64 (inches), resulting in a BMI of 38.8 kg/m2. The patient's neck circumference measured 14.5 inches.  CURRENT MEDICATIONS: Protonix, Fish Oil, Multivitamin, Hydroxyzine, Celexa, Tylenol   PROCEDURE:  This is a multichannel digital polysomnogram utilizing the Somnostar 11.2 system.  Electrodes and sensors were applied and monitored per AASM Specifications.   EEG, EOG, Chin and Limb EMG, were sampled at 200 Hz.  ECG, Snore and Nasal Pressure, Thermal Airflow, Respiratory Effort, CPAP Flow and Pressure, Oximetry was sampled at 50 Hz. Digital video and audio were recorded.      BASELINE STUDY: Lights Out was at 22:38 and Lights On at 05:00.  Total recording time (TRT) was 383 minutes, with a total sleep time (TST) of 240 minutes.   The patient's sleep latency was 23.5 minutes.  REM latency was 316.5 minutes.  The sleep efficiency was poor at 62.7 %.     SLEEP ARCHITECTURE: WASO (Wake after sleep onset) was 130.5 minutes.  There were 29.5 minutes in  Stage N1, 118.5 minutes Stage N2, 54 minutes Stage N3 and 38 minutes in Stage REM.  The percentage of Stage N1 was 12.3%, Stage N2 was 49.4%, Stage N3 was 22.5% and Stage R (REM sleep) was 15.8%.  REM latency was prolonged and all REM sleep clustered at the end of the night.   RESPIRATORY ANALYSIS:  There were a total of 0 respiratory events:  The patient also had 0 respiratory event related arousals (RERAs).     The total APNEA/HYPOPNEA INDEX (AHI) was 0/hour.  0 events occurred in REM sleep and 0 events in NREM. The REM AHI was  0 /hour, versus a non-REM AHI of 0. The patient spent 57 minutes of total sleep time in the supine position and 183 minutes in non-supine. The supine AHI was 0.0 versus a non-supine AHI of 0.0.  OXYGEN SATURATION & C02:  The Wake baseline 02 saturation was 98%, with the lowest being 92%. Time spent below 89% saturation equaled 0 minutes.  The arousals were noted as: 18 were spontaneous, 0 were associated with PLMs, 0 were associated with respiratory events. The patient had a total of 0 Periodic Limb Movements.    Audio and video analysis did not show any abnormal or unusual movements, behaviors, phonations or vocalizations.  Sleep was severely fragmented.   The patient took 2bathroom breaks. Snoring was not noted. EKG was in keeping with normal sinus rhythm (NSR).    IMPRESSION:  No evidence of a physiological organic sleep disorder. No evidence of heart rhythm  abnormality, either.   Insomnia for non-organic reasons. Anxiety, being hypervigilant.    RECOMMENDATIONS:  1. Follow up in our sleep clinic is optional. Cognitive behavior therapy is recommended.    I certify that I have reviewed the entire raw data recording prior to the issuance of this report in accordance with the Standards of Accreditation of the American Academy of Sleep Medicine (AASM)   Melvyn Novas, MD Diplomat, American Board of Psychiatry and Neurology  Diplomat, American Board of  Sleep Medicine Wellsite geologist, [facility] [employer]

## 2019-12-01 ENCOUNTER — Other Ambulatory Visit: Payer: Self-pay | Admitting: Obstetrics & Gynecology

## 2019-12-01 DIAGNOSIS — N643 Galactorrhea not associated with childbirth: Secondary | ICD-10-CM

## 2019-12-16 ENCOUNTER — Ambulatory Visit
Admission: RE | Admit: 2019-12-16 | Discharge: 2019-12-16 | Disposition: A | Discharge status: Home / Self Care | Payer: BC Managed Care – PPO | Source: Ambulatory Visit | Attending: Obstetrics & Gynecology | Admitting: Obstetrics & Gynecology

## 2019-12-16 ENCOUNTER — Other Ambulatory Visit: Payer: Self-pay | Admitting: Obstetrics & Gynecology

## 2019-12-16 DIAGNOSIS — N643 Galactorrhea not associated with childbirth: Secondary | ICD-10-CM

## 2019-12-16 DIAGNOSIS — R921 Mammographic calcification found on diagnostic imaging of breast: Secondary | ICD-10-CM

## 2019-12-16 IMAGING — US US BREAST*L* LIMITED INC AXILLA
1 series · 5 of 5 positions shown · non-contrast
Comparison: None.

CLINICAL DATA: 34-year-old female presenting with bilateral nipple
discharge for approximately 6 months. Patient reports this has been
both spontaneous and when expressed. She has noticed occasionally
clear discharge as well as brown and sometimes milky discharge.

EXAM:
DIGITAL DIAGNOSTIC BILATERAL MAMMOGRAM WITH CAD AND TOMO
ULTRASOUND BILATERAL BREAST

[Series 1: us breast*left* limited inc axilla · 0.06mm/px · 5 of 5 slices shown]
[im 1/5]
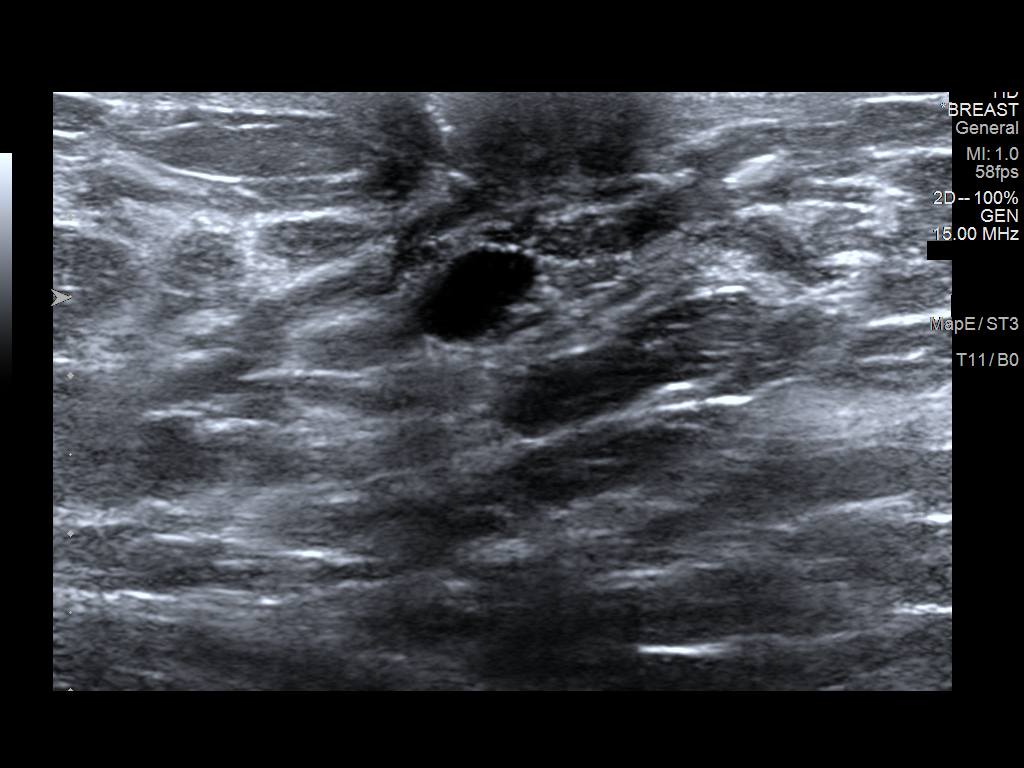
[im 2/5]
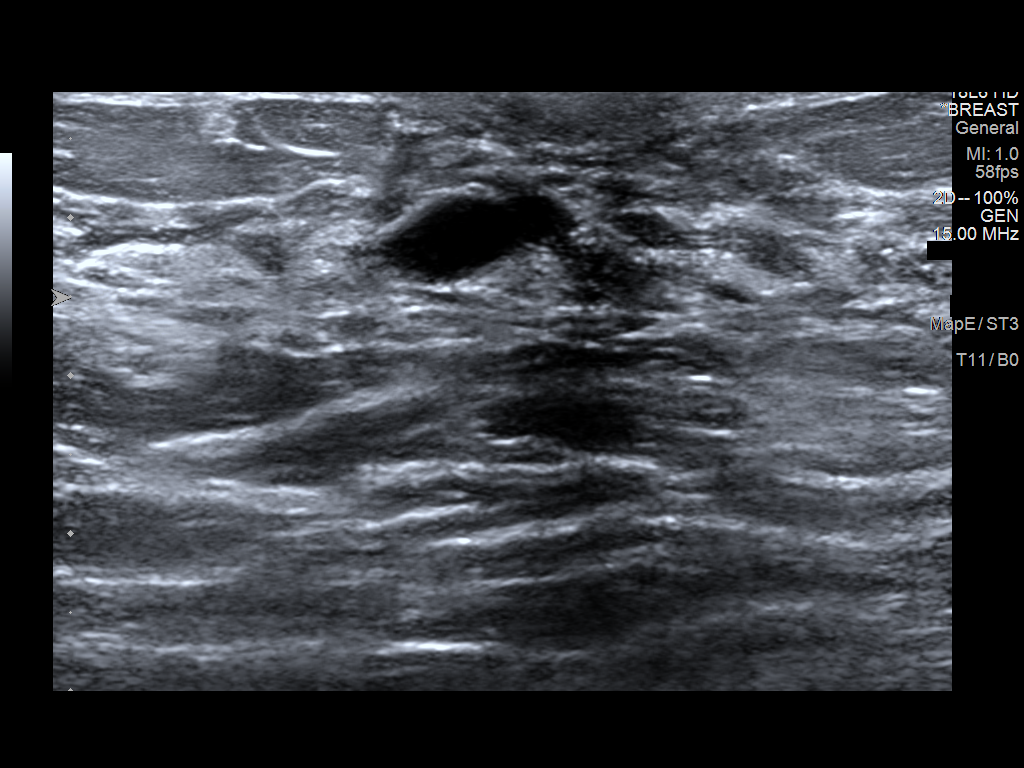
[im 3/5]
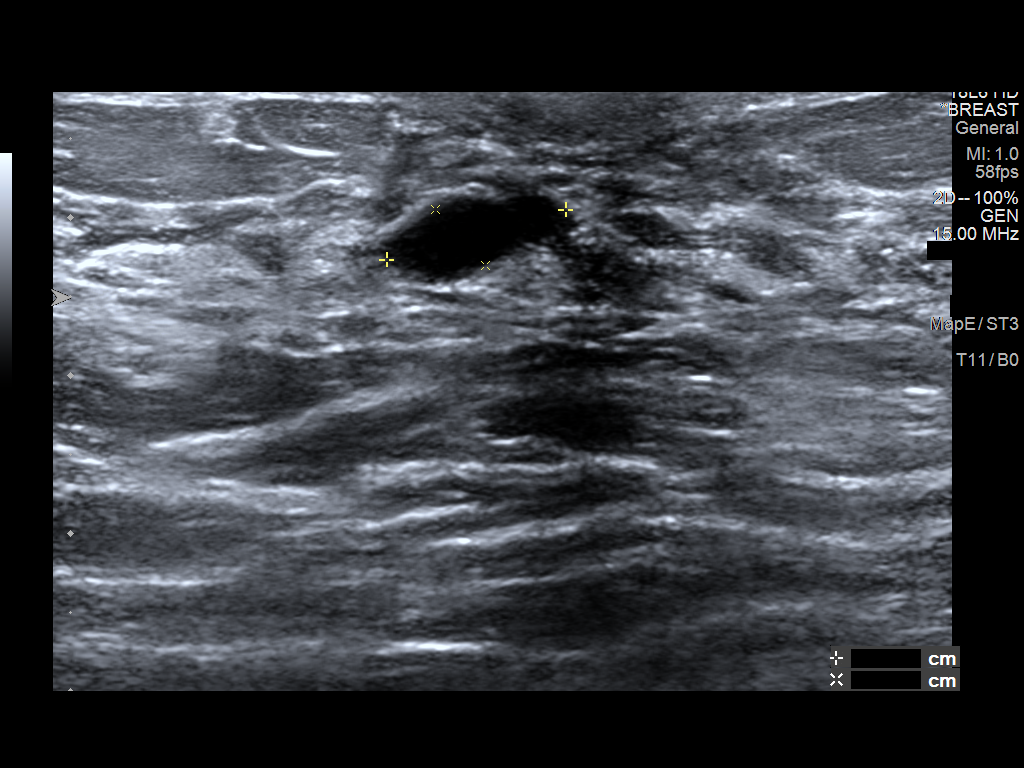
[im 4/5]
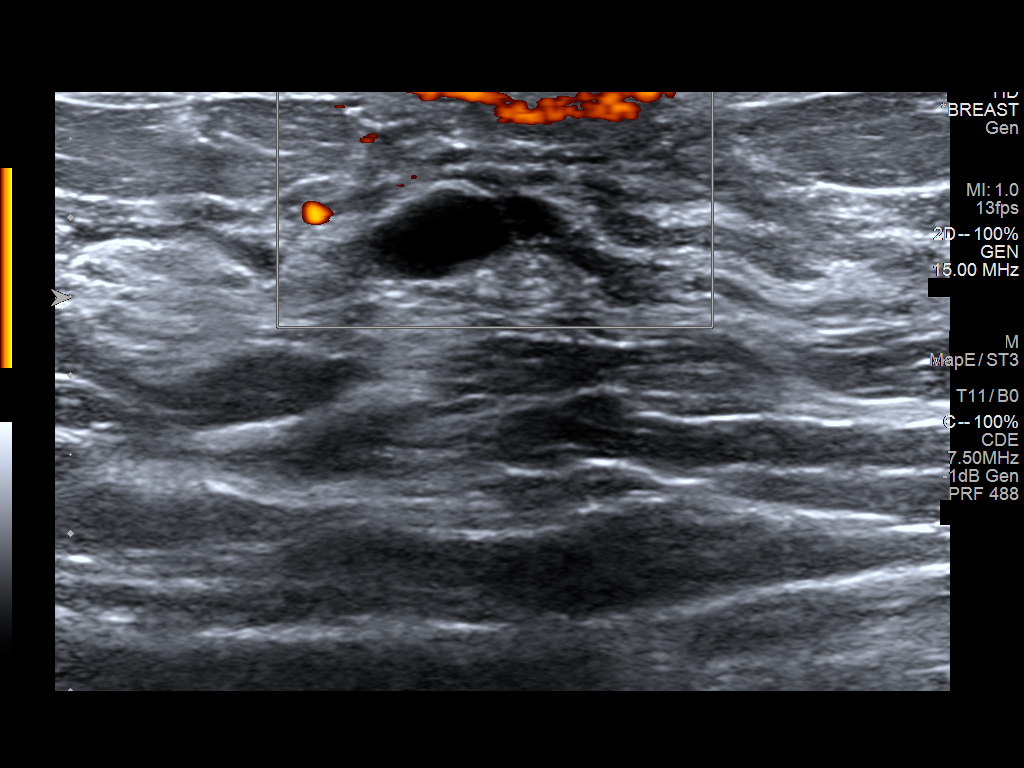
[im 5/5]
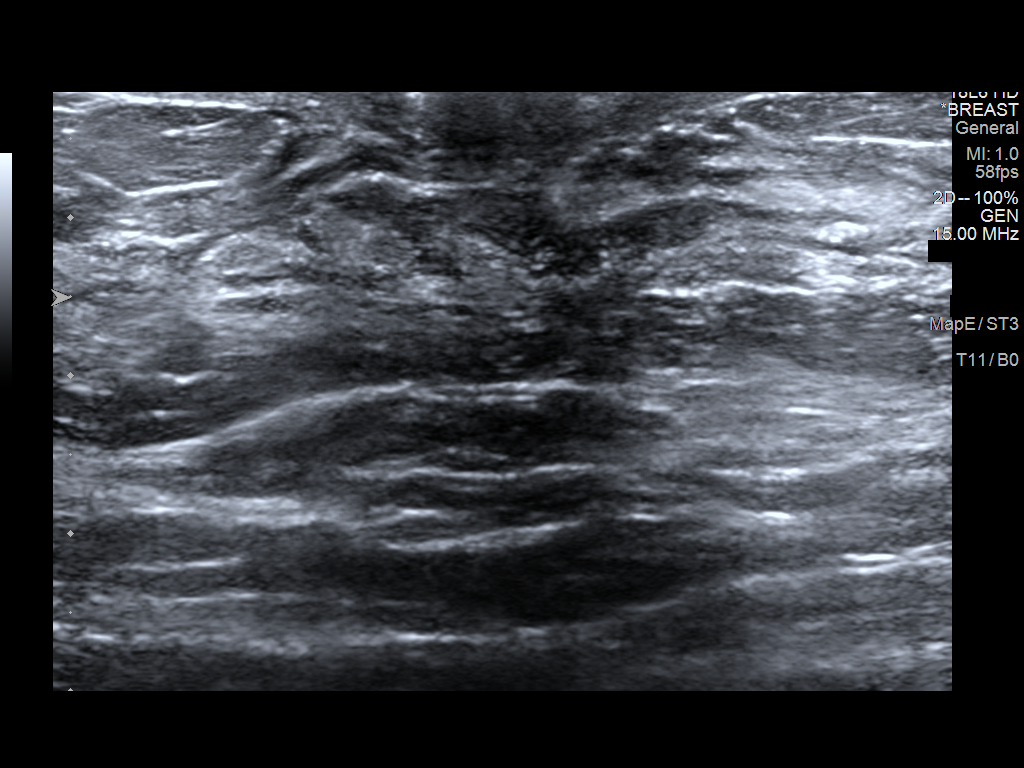

[5 of 5 positions shown; findings below may reference images not displayed]

ACR Breast Density Category c: The breast tissue is heterogeneously
dense, which may obscure small masses.
FINDINGS: Mammogram:

Right breast: Spot compression tomosynthesis views of the
retroareolar right breast were performed. There is no definite
abnormality in the retroareolar breast to explain the patient's
nipple discharge. Spot 2D magnification views were performed for
calcifications in the upper inner right breast. On the spot imaging
there is a 4 mm group of pleomorphic calcifications.

Left breast: Spot compression tomosynthesis views of the
retroareolar left breast were performed. There is a possible 1.1 cm
oval circumscribed mass. No additional finding elsewhere in the left
breast.

Mammographic images were processed with CAD.

Ultrasound:

Right breast:

Targeted ultrasound is performed in the retroareolar right breast
demonstrating no suspicious cystic or solid mass or dilated duct to
explain the patient's nipple discharge.

Left breast:

Targeted ultrasound is performed in the retroareolar left breast
demonstrating a likely incidental oval circumscribed anechoic mass
measuring 0.8 x 0.5 x 1.2 cm, consistent with a benign simple cyst.
No suspicious cystic or solid mass.
IMPRESSION: 1. No mammographic or sonographic evidence of malignancy in the
bilateral retroareolar regions to explain the patient's nipple
discharge.

2. Indeterminate calcifications spanning 4 mm in the upper inner
quadrant of the right breast.

3.  Incidental benign simple cyst in the retroareolar left breast.

RECOMMENDATION:
1. Stereotactic core needle biopsy of the right breast
calcifications.

2. Bilateral breast MRI for further evaluation of the patient's
nipple discharge. Would recommend scheduling this for at least two
weeks after the patient's breast biopsy to allow for the biopsy site
changes to resolve.

3.  Surgical consultation for nipple discharge.

I have discussed the findings and recommendations with the patient.
If applicable, a reminder letter will be sent to the patient
regarding the next appointment.

BI-RADS CATEGORY  4: Suspicious.

## 2019-12-16 IMAGING — US US BREAST*R* LIMITED INC AXILLA
1 series · 3 of 3 positions shown · non-contrast
Comparison: None.

CLINICAL DATA: 34-year-old female presenting with bilateral nipple
discharge for approximately 6 months. Patient reports this has been
both spontaneous and when expressed. She has noticed occasionally
clear discharge as well as brown and sometimes milky discharge.

EXAM:
DIGITAL DIAGNOSTIC BILATERAL MAMMOGRAM WITH CAD AND TOMO
ULTRASOUND BILATERAL BREAST

[Series 1: us breast*right* limited inc axilla · 0.07mm/px · 3 of 3 slices shown]
[im 1/3]
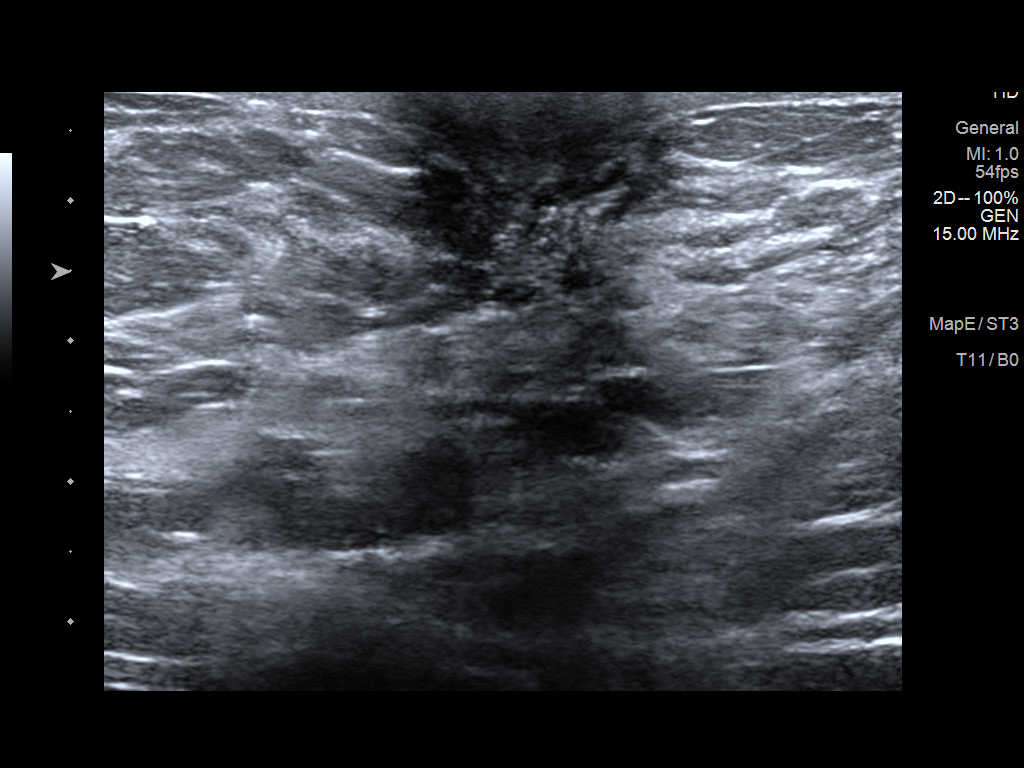
[im 2/3]
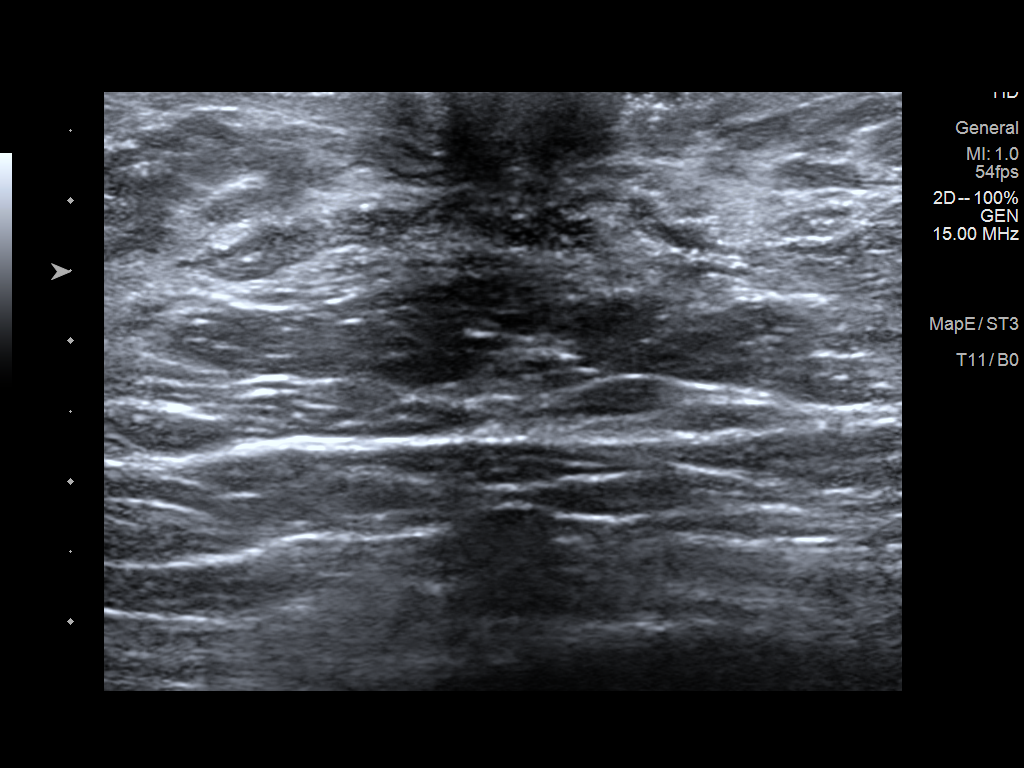
[im 3/3]
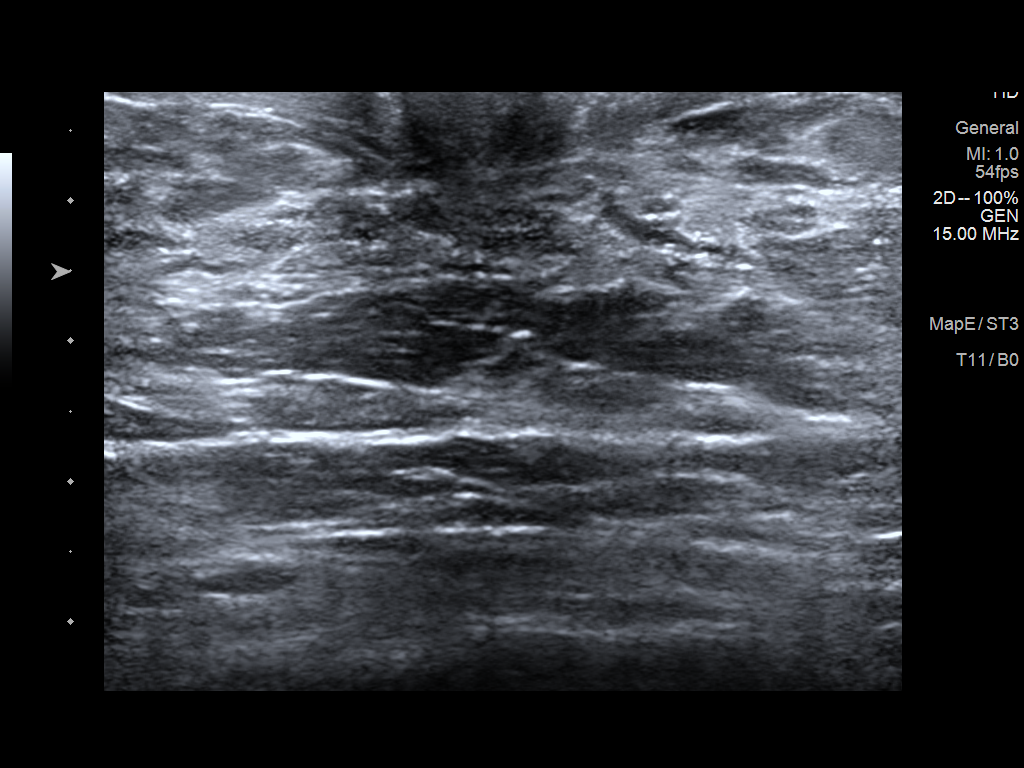

[3 of 3 positions shown; findings below may reference images not displayed]

ACR Breast Density Category c: The breast tissue is heterogeneously
dense, which may obscure small masses.
FINDINGS: Mammogram:

Right breast: Spot compression tomosynthesis views of the
retroareolar right breast were performed. There is no definite
abnormality in the retroareolar breast to explain the patient's
nipple discharge. Spot 2D magnification views were performed for
calcifications in the upper inner right breast. On the spot imaging
there is a 4 mm group of pleomorphic calcifications.

Left breast: Spot compression tomosynthesis views of the
retroareolar left breast were performed. There is a possible 1.1 cm
oval circumscribed mass. No additional finding elsewhere in the left
breast.

Mammographic images were processed with CAD.

Ultrasound:

Right breast:

Targeted ultrasound is performed in the retroareolar right breast
demonstrating no suspicious cystic or solid mass or dilated duct to
explain the patient's nipple discharge.

Left breast:

Targeted ultrasound is performed in the retroareolar left breast
demonstrating a likely incidental oval circumscribed anechoic mass
measuring 0.8 x 0.5 x 1.2 cm, consistent with a benign simple cyst.
No suspicious cystic or solid mass.
IMPRESSION: 1. No mammographic or sonographic evidence of malignancy in the
bilateral retroareolar regions to explain the patient's nipple
discharge.

2. Indeterminate calcifications spanning 4 mm in the upper inner
quadrant of the right breast.

3.  Incidental benign simple cyst in the retroareolar left breast.

RECOMMENDATION:
1. Stereotactic core needle biopsy of the right breast
calcifications.

2. Bilateral breast MRI for further evaluation of the patient's
nipple discharge. Would recommend scheduling this for at least two
weeks after the patient's breast biopsy to allow for the biopsy site
changes to resolve.

3.  Surgical consultation for nipple discharge.

I have discussed the findings and recommendations with the patient.
If applicable, a reminder letter will be sent to the patient
regarding the next appointment.

BI-RADS CATEGORY  4: Suspicious.

## 2019-12-16 IMAGING — MG DIGITAL DIAGNOSTIC BILAT W/ TOMO W/ CAD
8 of 15 series · 8 of 39 positions shown · non-contrast
Comparison: None.

CLINICAL DATA: 34-year-old female presenting with bilateral nipple
discharge for approximately 6 months. Patient reports this has been
both spontaneous and when expressed. She has noticed occasionally
clear discharge as well as brown and sometimes milky discharge.

EXAM:
DIGITAL DIAGNOSTIC BILATERAL MAMMOGRAM WITH CAD AND TOMO
ULTRASOUND BILATERAL BREAST

[R ML (1 of 2)]
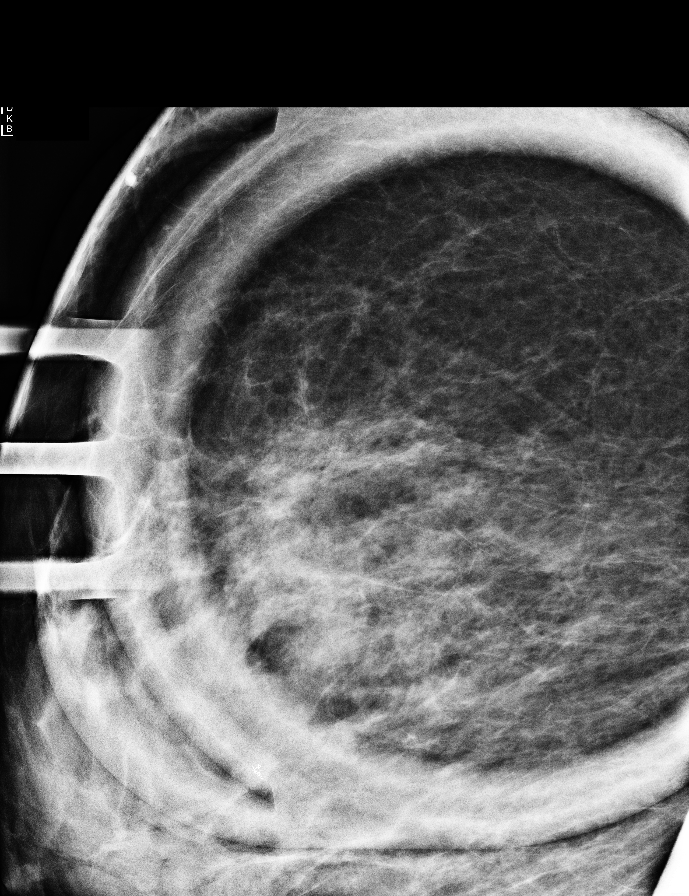

[R ML (2 of 2)]
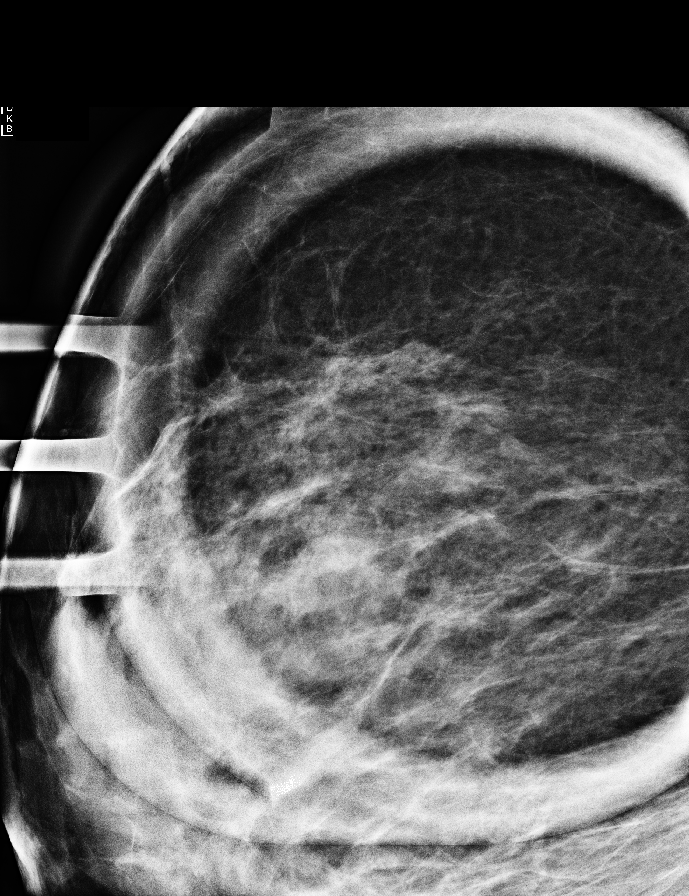

[R CC]
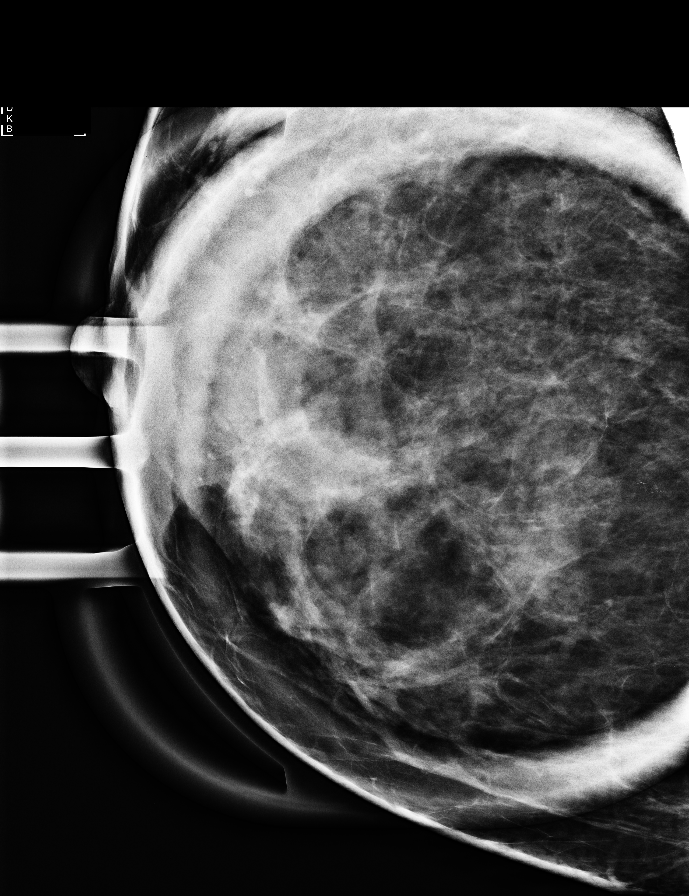

[L CC synth-2D (1 of 2)]
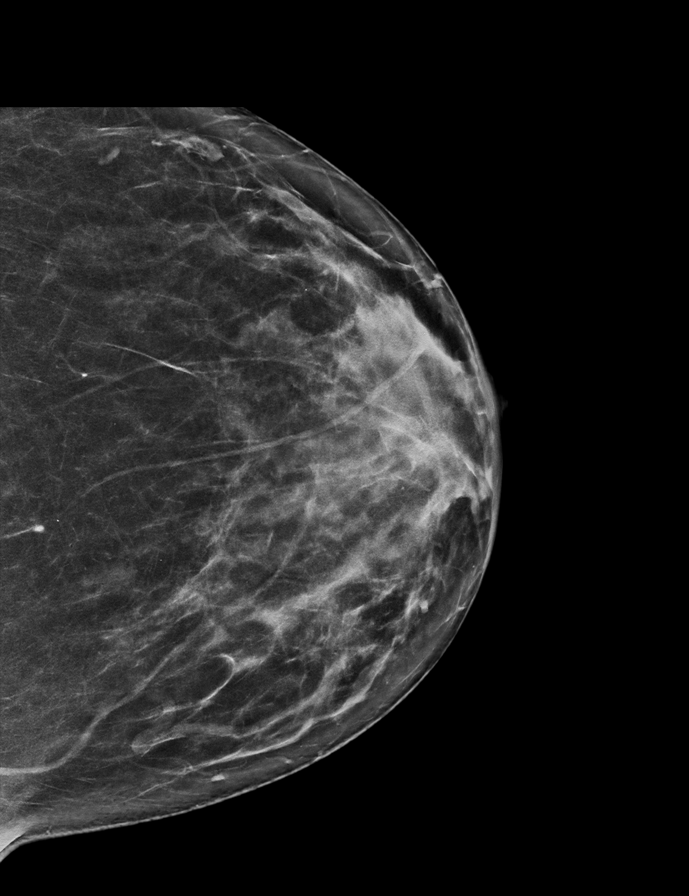

[L CC synth-2D (2 of 2)]
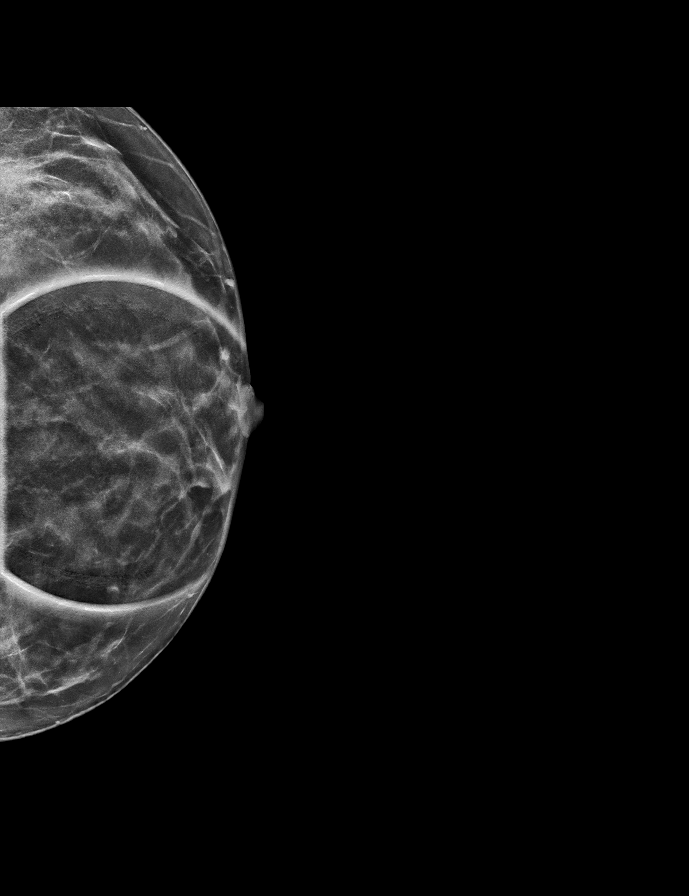

[R MLO synth-2D]
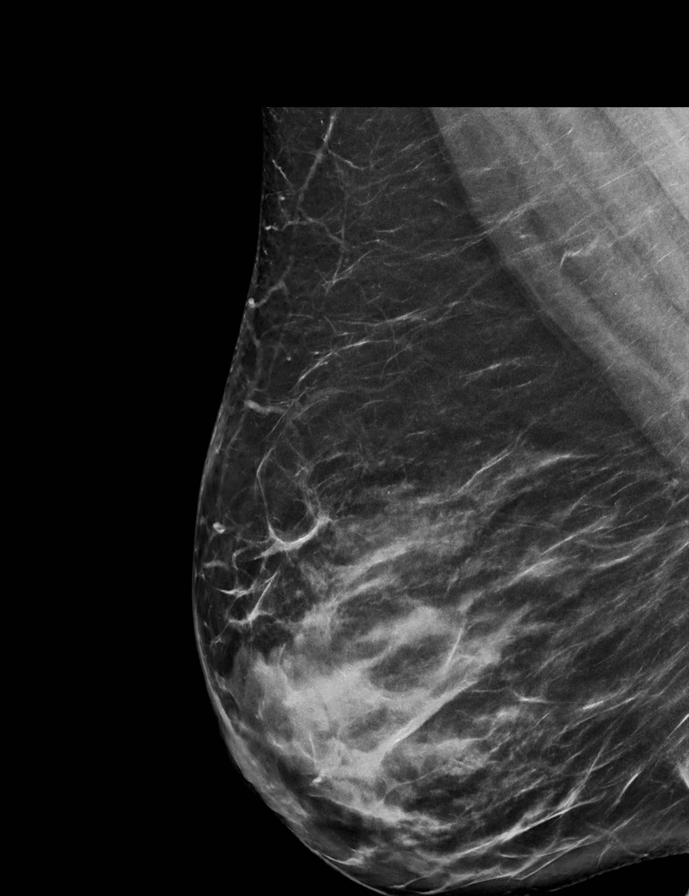

[L MLO synth-2D]
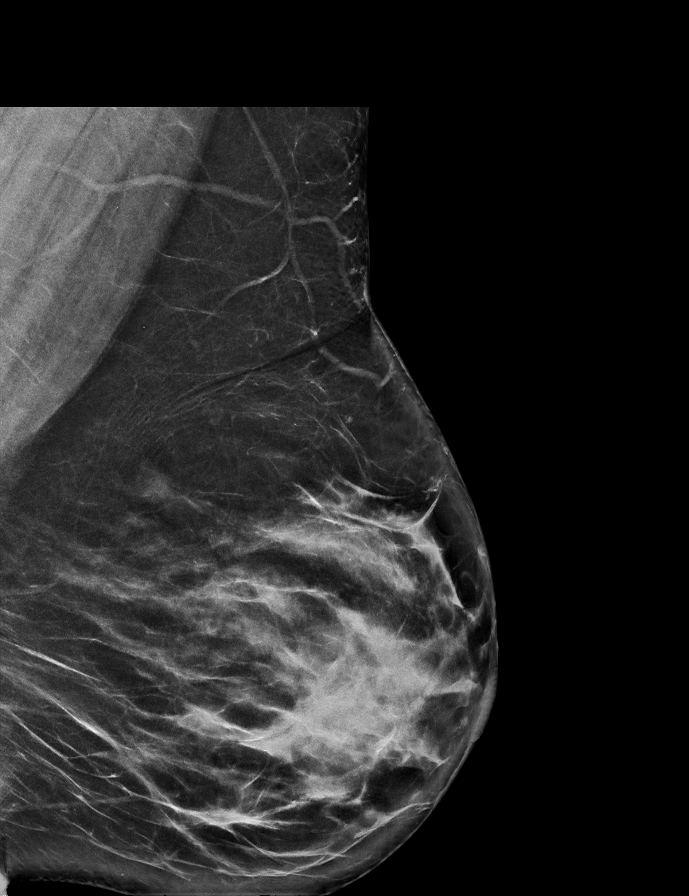

[R CC synth-2D]
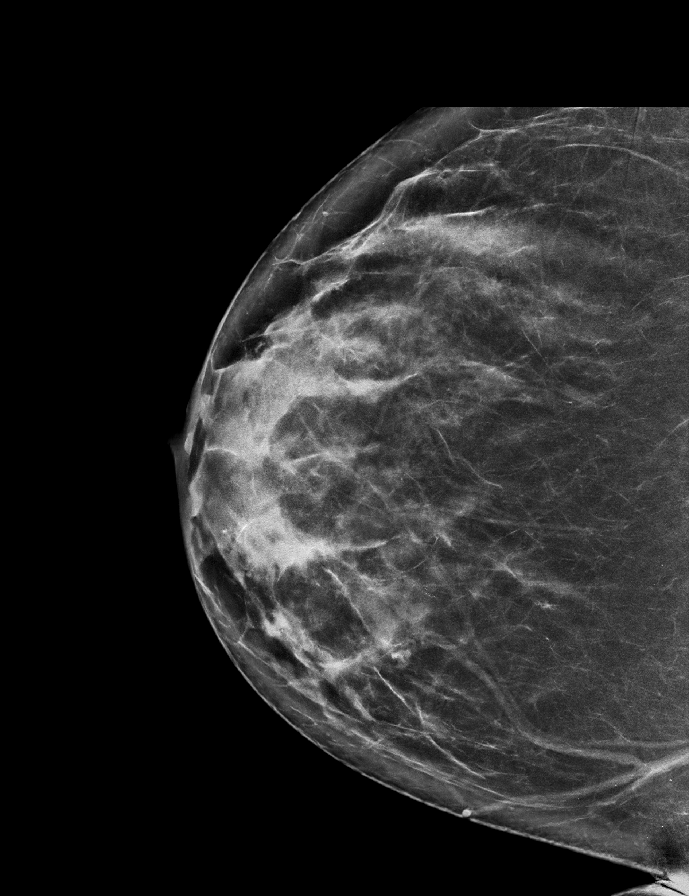

[8 of 39 positions shown; findings below may reference images not displayed]

ACR Breast Density Category c: The breast tissue is heterogeneously
dense, which may obscure small masses.
FINDINGS: Mammogram:

Right breast: Spot compression tomosynthesis views of the
retroareolar right breast were performed. There is no definite
abnormality in the retroareolar breast to explain the patient's
nipple discharge. Spot 2D magnification views were performed for
calcifications in the upper inner right breast. On the spot imaging
there is a 4 mm group of pleomorphic calcifications.

Left breast: Spot compression tomosynthesis views of the
retroareolar left breast were performed. There is a possible 1.1 cm
oval circumscribed mass. No additional finding elsewhere in the left
breast.

Mammographic images were processed with CAD.

Ultrasound:

Right breast:

Targeted ultrasound is performed in the retroareolar right breast
demonstrating no suspicious cystic or solid mass or dilated duct to
explain the patient's nipple discharge.

Left breast:

Targeted ultrasound is performed in the retroareolar left breast
demonstrating a likely incidental oval circumscribed anechoic mass
measuring 0.8 x 0.5 x 1.2 cm, consistent with a benign simple cyst.
No suspicious cystic or solid mass.
IMPRESSION: 1. No mammographic or sonographic evidence of malignancy in the
bilateral retroareolar regions to explain the patient's nipple
discharge.

2. Indeterminate calcifications spanning 4 mm in the upper inner
quadrant of the right breast.

3.  Incidental benign simple cyst in the retroareolar left breast.

RECOMMENDATION:
1. Stereotactic core needle biopsy of the right breast
calcifications.

2. Bilateral breast MRI for further evaluation of the patient's
nipple discharge. Would recommend scheduling this for at least two
weeks after the patient's breast biopsy to allow for the biopsy site
changes to resolve.

3.  Surgical consultation for nipple discharge.

I have discussed the findings and recommendations with the patient.
If applicable, a reminder letter will be sent to the patient
regarding the next appointment.

BI-RADS CATEGORY  4: Suspicious.

## 2019-12-27 ENCOUNTER — Other Ambulatory Visit: Payer: Self-pay

## 2019-12-27 ENCOUNTER — Ambulatory Visit
Admission: RE | Admit: 2019-12-27 | Discharge: 2019-12-27 | Disposition: A | Discharge status: Home / Self Care | Payer: BC Managed Care – PPO | Source: Ambulatory Visit | Attending: Obstetrics & Gynecology | Admitting: Obstetrics & Gynecology

## 2019-12-27 DIAGNOSIS — R921 Mammographic calcification found on diagnostic imaging of breast: Secondary | ICD-10-CM

## 2019-12-27 IMAGING — MG MM BREAST BX W/ LOC DEV 1ST LESION IMAGE BX SPEC STEREO GUIDE*R*
7 series · 8 of 19 positions shown · non-contrast
Comparison: Previous exams.
COMPARISON: Previous exams.

Addendum:
CLINICAL DATA: 34-year-old female for tissue sampling of UPPER
INNER RIGHT breast calcifications.

EXAM:
RIGHT BREAST STEREOTACTIC CORE NEEDLE BIOPSY

[R (1 of 3)]
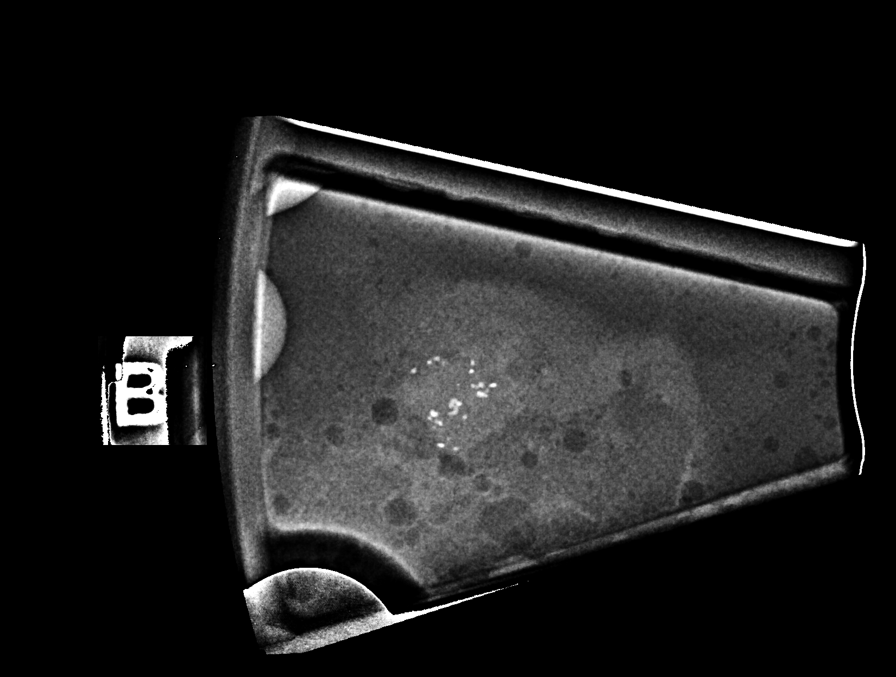

[R (2 of 3)]
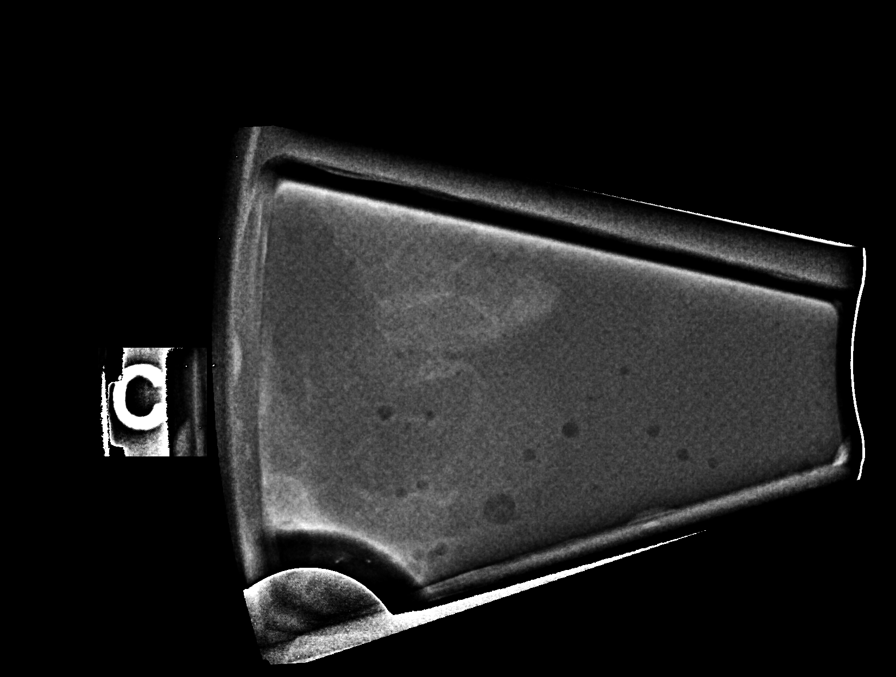

[R (3 of 3)]
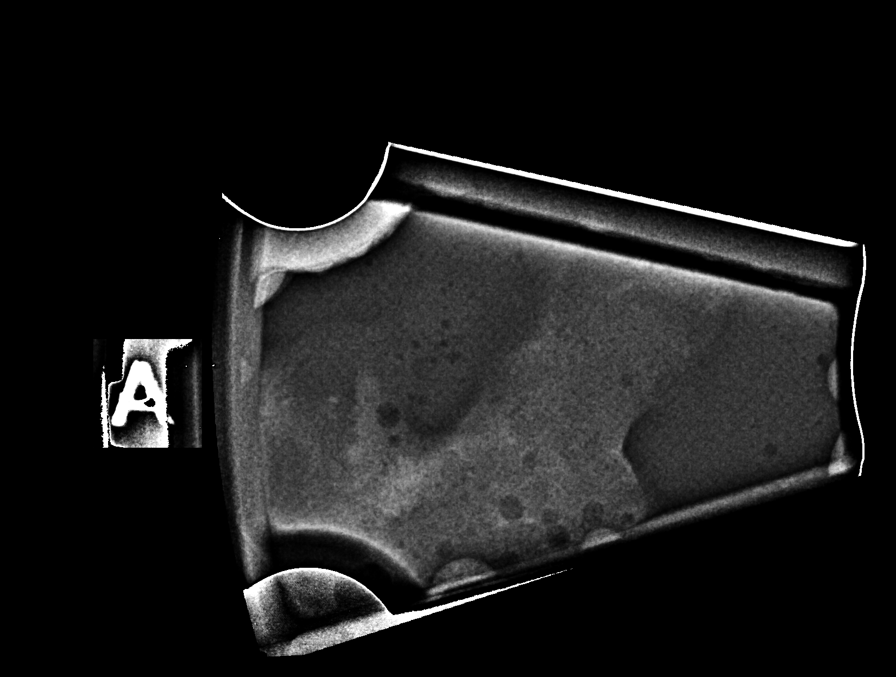

[R CC]
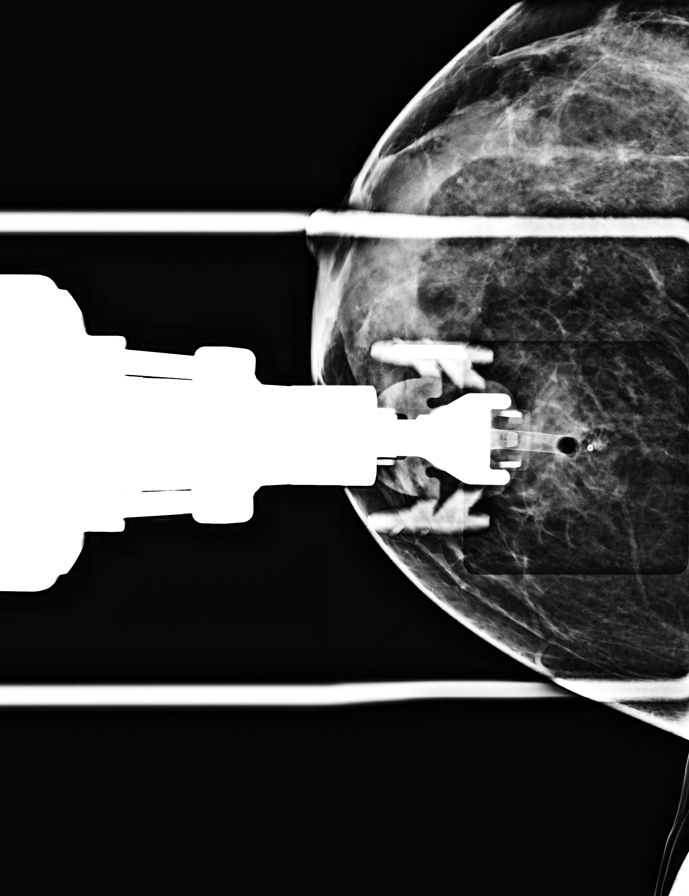

[R CC tomo · 2 of 54 frames shown (1 of 3)]
[frame 18/54]
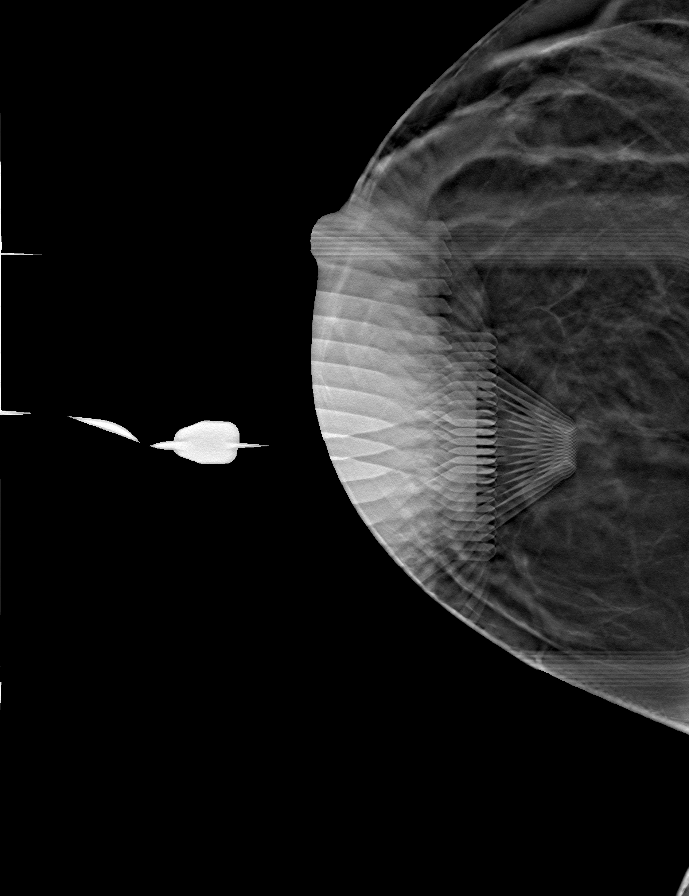
[frame 27/54]
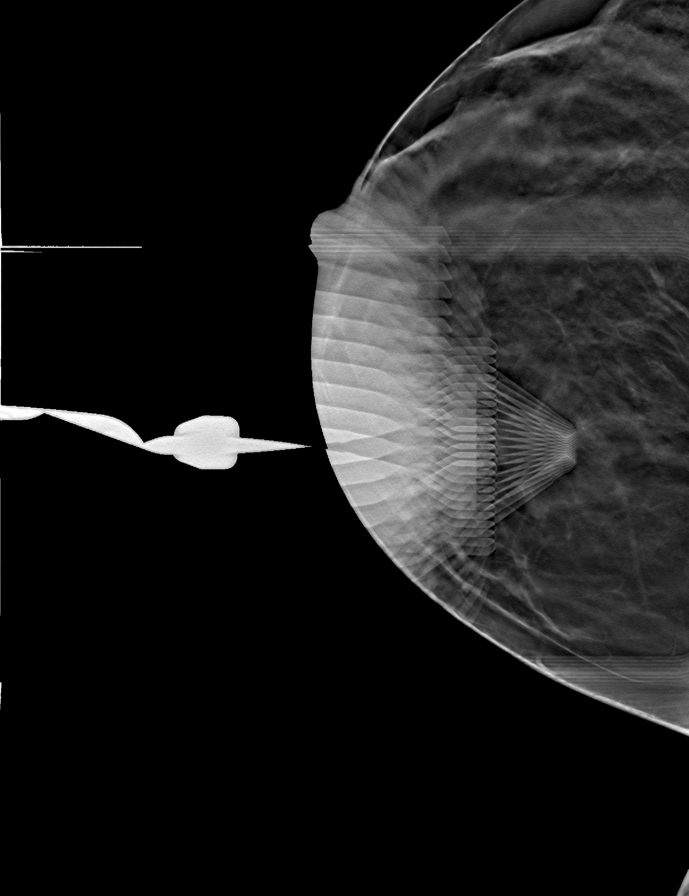

[R CC tomo (2 of 3) · tomo slice 28/55.0]
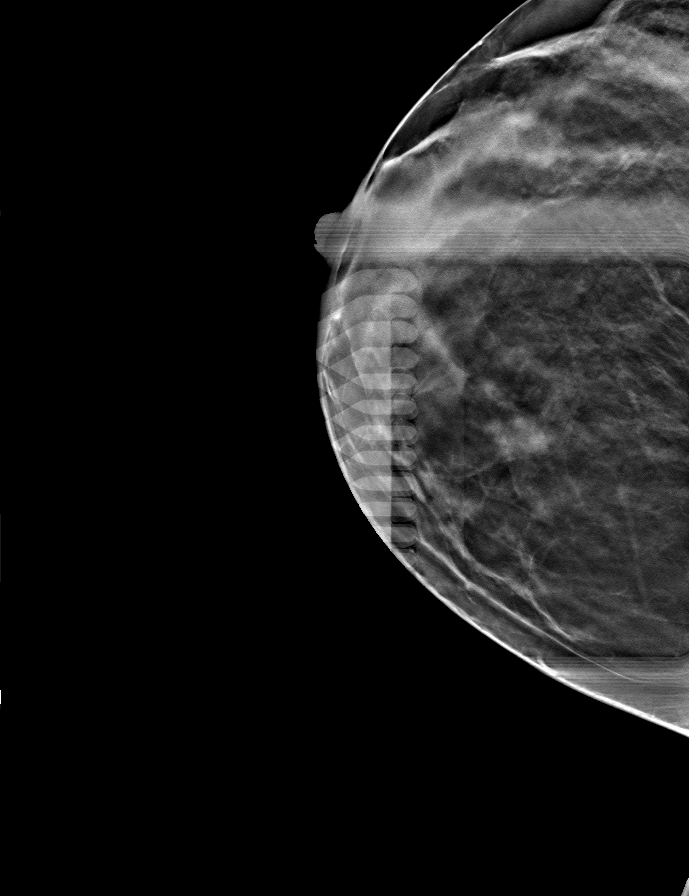

[R CC tomo (3 of 3) · tomo slice 29/56.0]
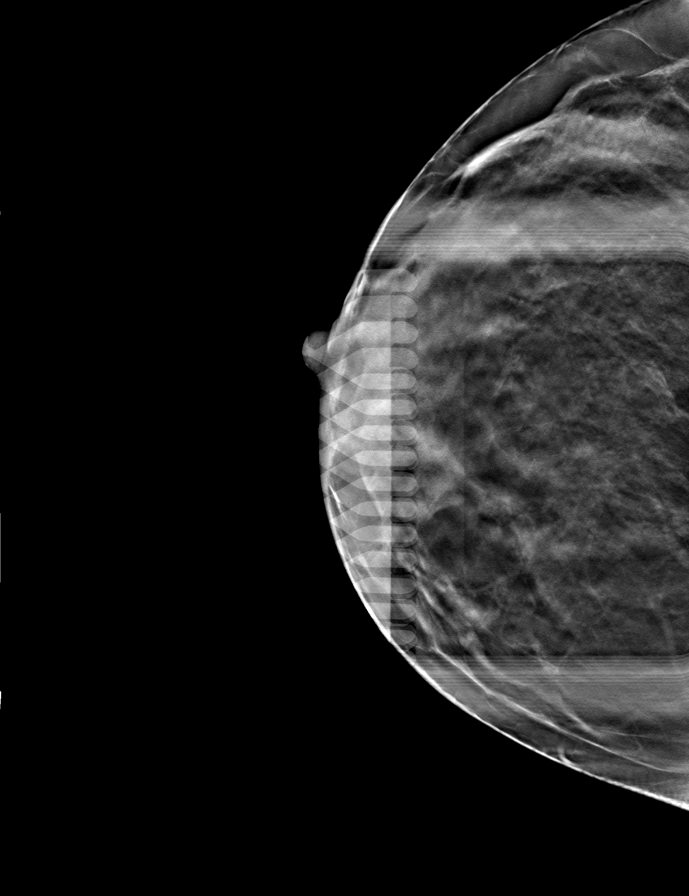

[8 of 19 positions shown; findings below may reference images not displayed]



Using sterile technique and 1% Lidocaine as local anesthetic, under
stereotactic guidance, a 9 gauge vacuum assisted device was used to
perform core needle biopsy of 0.4 cm group of calcifications within
the UPPER INNER quadrant of the RIGHT breast using a SUPERIOR
approach. Specimen radiograph was performed showing calcifications.
Specimens with calcifications are identified for pathology.

Lesion quadrant: UPPER INNER RIGHT breast

At the conclusion of the procedure, a COIL tissue marker clip was
deployed into the biopsy cavity. Follow-up 2-view mammogram was
performed and dictated separately.
IMPRESSION: Stereotactic-guided biopsy of UPPER INNER RIGHT breast
calcifications. No apparent complications.

ADDENDUM:
Pathology revealed FIBROCYSTIC CHANGES WITH CALCIFICATIONS of the
RIGHT breast, upper inner. This was found to be concordant by Dr.
LACRIM.

Pathology results were discussed with the patient by telephone. The
patient reported doing well after the biopsy with tenderness at the
site. Post biopsy instructions and care were reviewed and questions
were answered. The patient was encouraged to call The [REDACTED] for any additional concerns. My direct phone
number was provided.

Surgical consultation has been arranged with Dr. LACRIM at
[REDACTED] on [DATE].

Recommendation for a bilateral breast MRI for further evaluation of
BILATERAL nipple discharge.

Pathology results reported by LACRIM, RN on [DATE].



Using sterile technique and 1% Lidocaine as local anesthetic, under
stereotactic guidance, a 9 gauge vacuum assisted device was used to
perform core needle biopsy of 0.4 cm group of calcifications within
the UPPER INNER quadrant of the RIGHT breast using a SUPERIOR
approach. Specimen radiograph was performed showing calcifications.
Specimens with calcifications are identified for pathology.

Lesion quadrant: UPPER INNER RIGHT breast

At the conclusion of the procedure, a COIL tissue marker clip was
deployed into the biopsy cavity. Follow-up 2-view mammogram was
performed and dictated separately.
IMPRESSION: Stereotactic-guided biopsy of UPPER INNER RIGHT breast
calcifications. No apparent complications.

## 2019-12-27 IMAGING — MG MM BREAST LOCALIZATION CLIP
4 series · 4 of 12 positions shown · non-contrast
Comparison: Previous exam(s).

CLINICAL DATA: Evaluate COIL biopsy clip following stereotactic
guided biopsy of UPPER INNER RIGHT breast calcifications.

EXAM:
DIAGNOSTIC RIGHT MAMMOGRAM POST STEREOTACTIC BIOPSY

[R ML synth-2D]
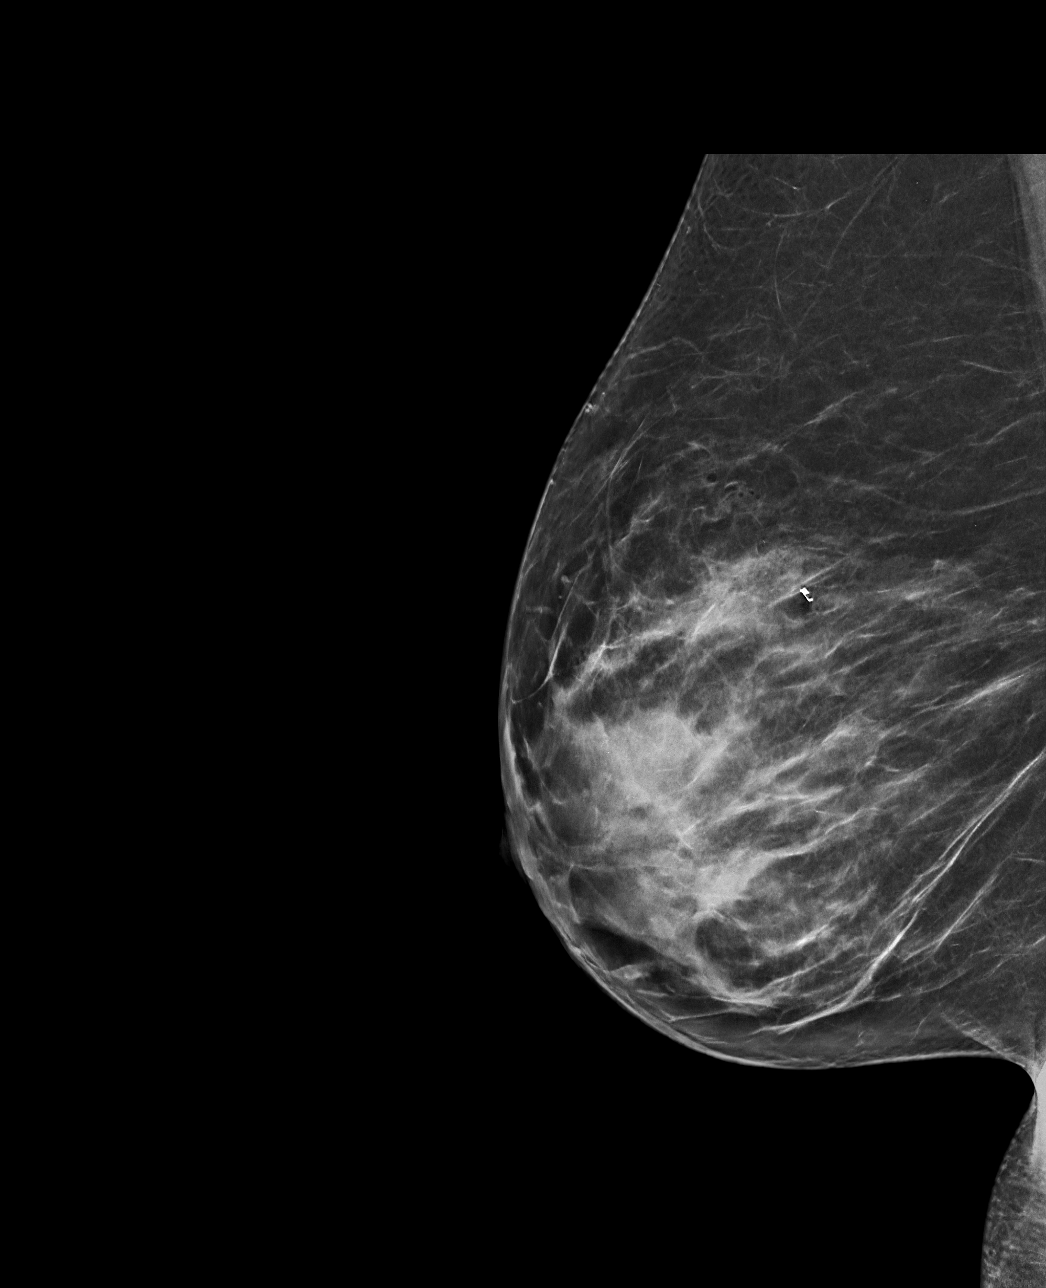

[R CC synth-2D]
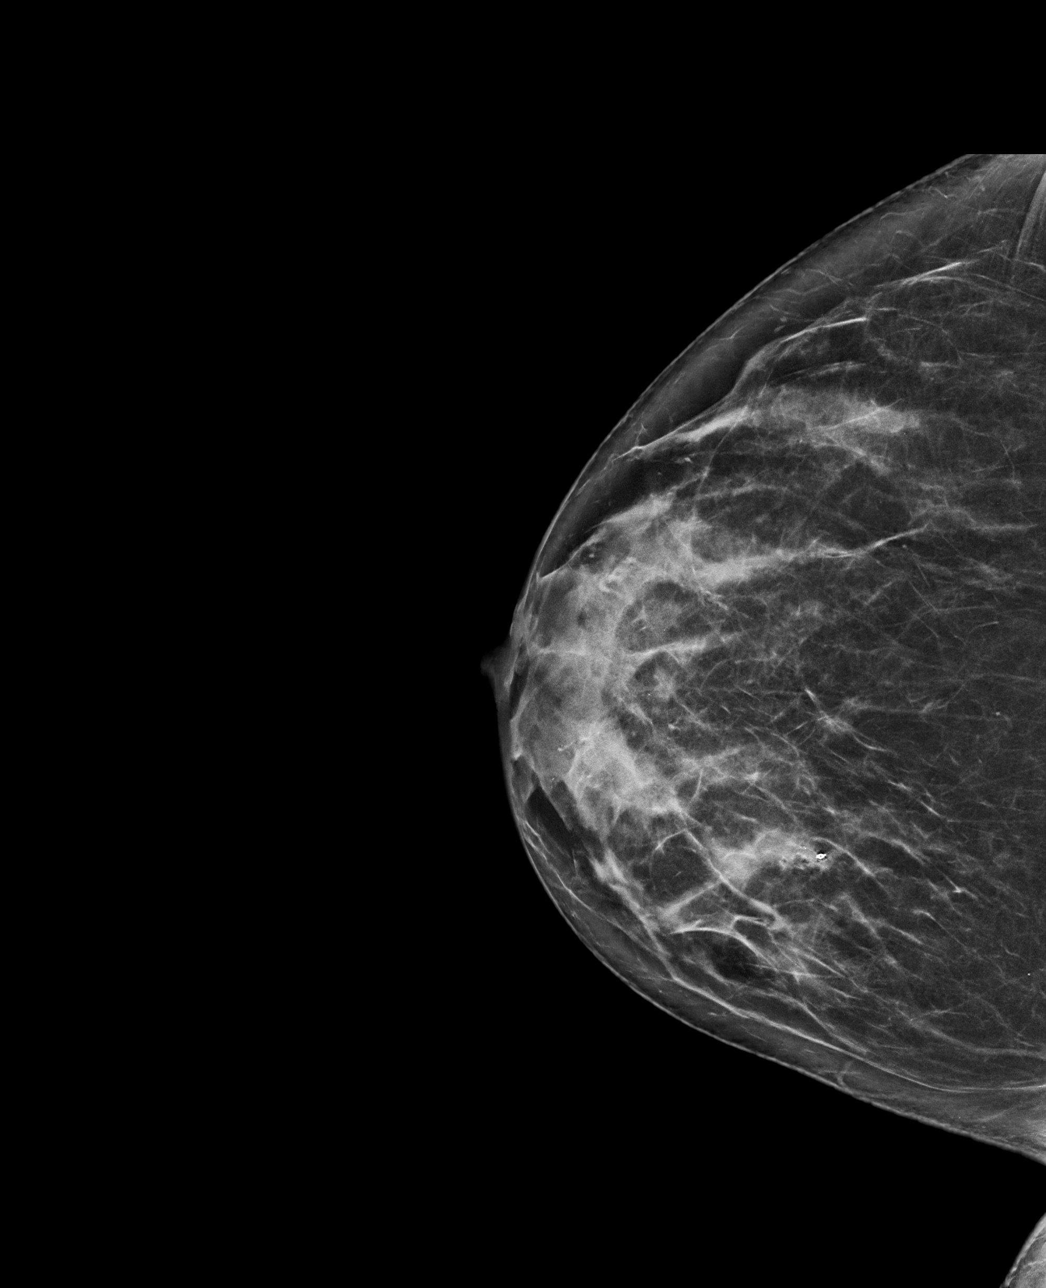

[R ML tomo · tomo slice 39/77.0]
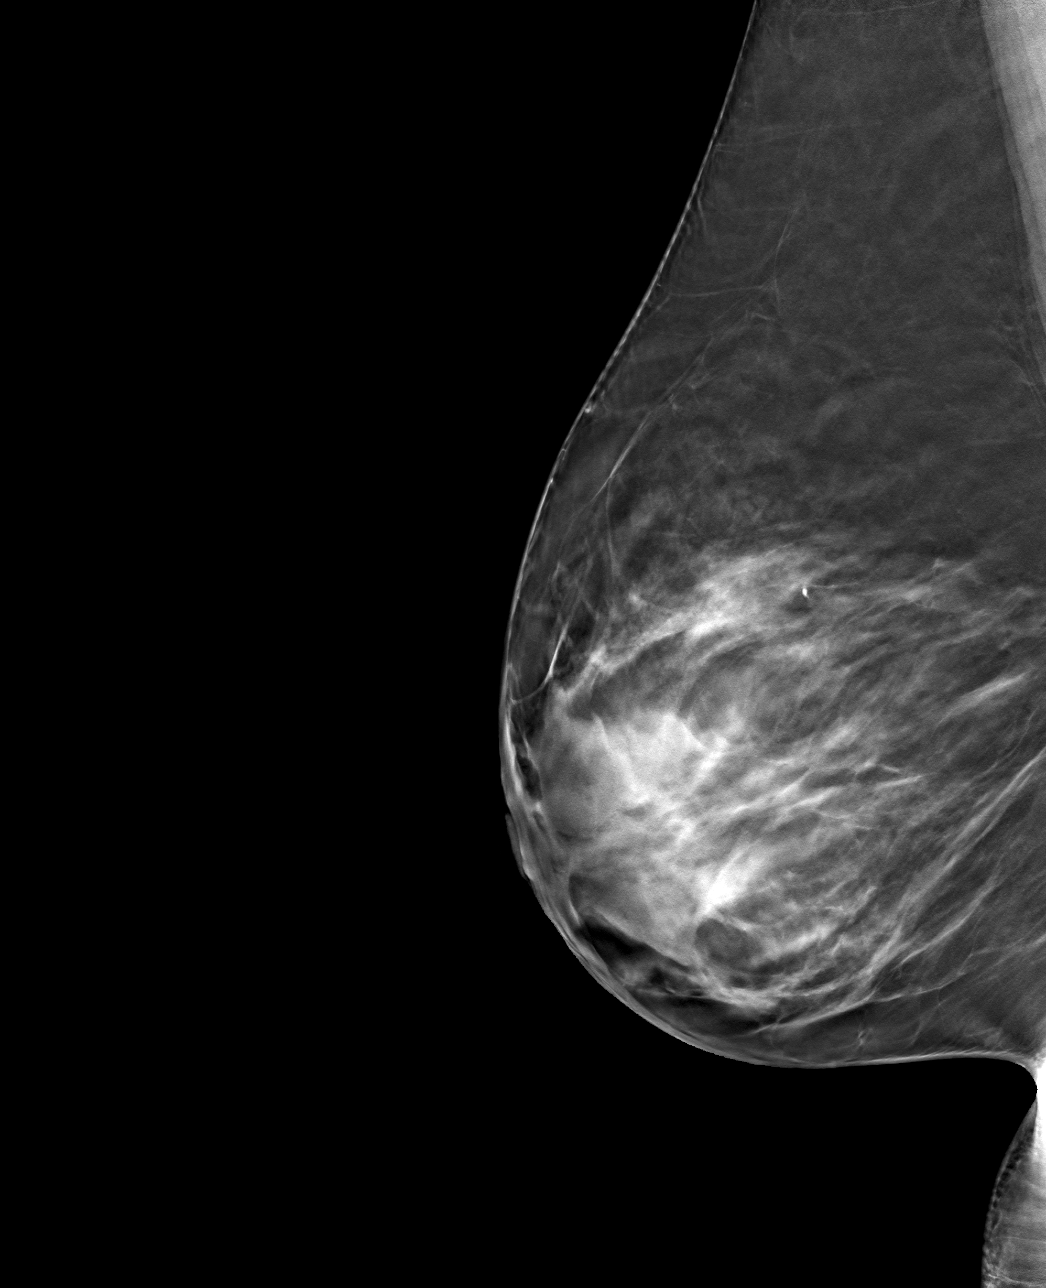

[R CC tomo · tomo slice 38/75.0]
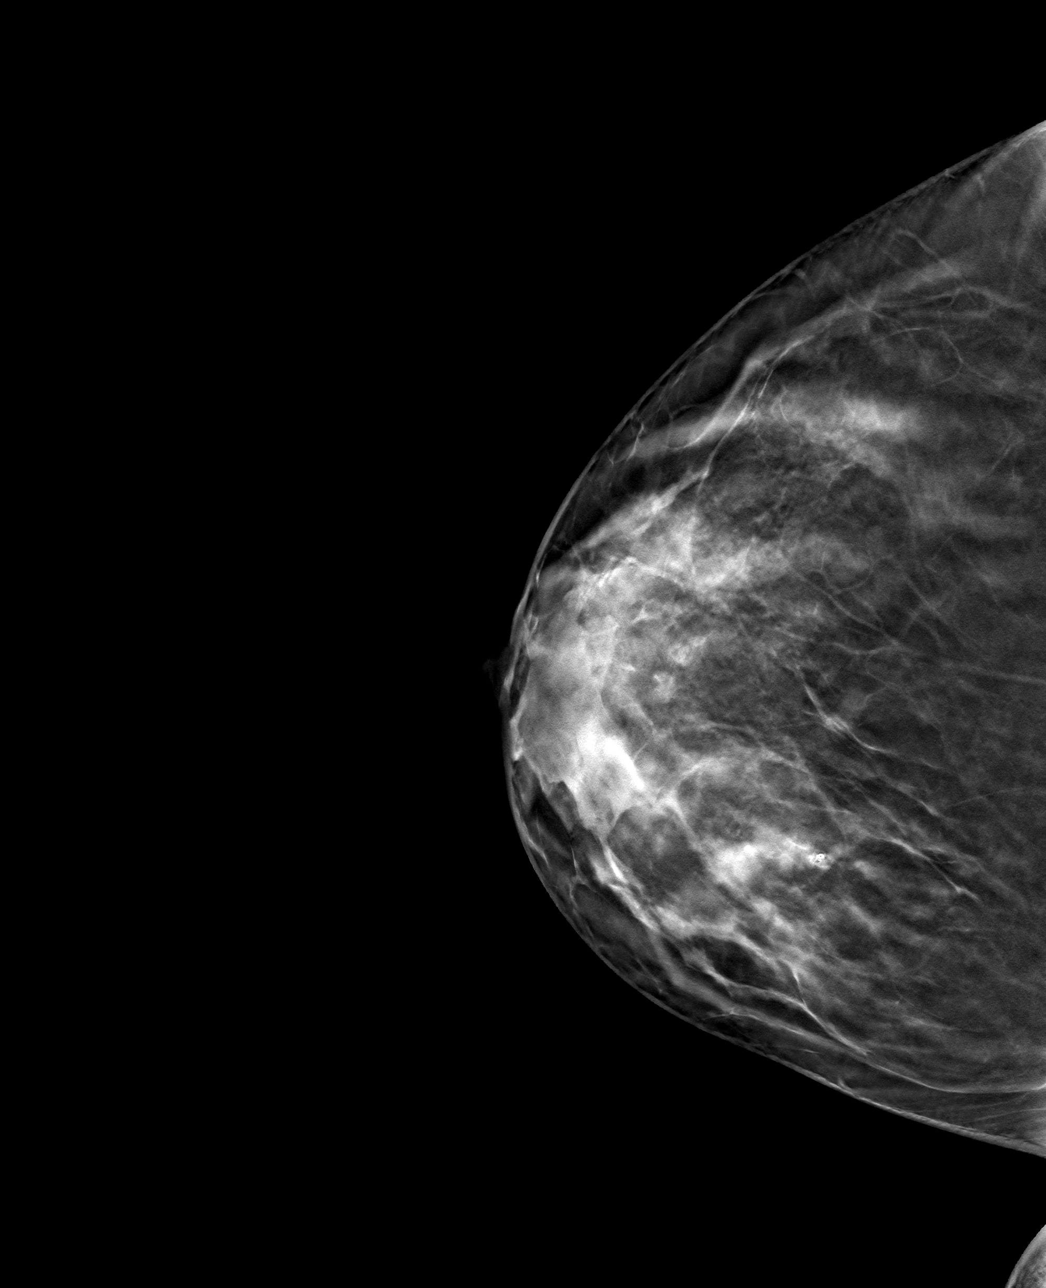

[4 of 12 positions shown; findings below may reference images not displayed]

FINDINGS: Mammographic images were obtained following stereotactic guided
biopsy of 0.4 cm group of UPPER INNER RIGHT breast calcifications.
The COIL biopsy marking clip is in expected position at the site of
biopsy.
IMPRESSION: Appropriate positioning of the COIL shaped biopsy marking clip at
the site of biopsy in the UPPER INNER RIGHT breast.

Final Assessment: Post Procedure Mammograms for Marker Placement

## 2019-12-30 ENCOUNTER — Other Ambulatory Visit: Payer: Self-pay | Admitting: Obstetrics & Gynecology

## 2019-12-30 DIAGNOSIS — N6452 Nipple discharge: Secondary | ICD-10-CM

## 2020-02-08 ENCOUNTER — Ambulatory Visit
Admission: RE | Admit: 2020-02-08 | Discharge: 2020-02-08 | Disposition: A | Discharge status: Home / Self Care | Payer: BC Managed Care – PPO | Source: Ambulatory Visit | Attending: Obstetrics & Gynecology | Admitting: Obstetrics & Gynecology

## 2020-02-08 ENCOUNTER — Other Ambulatory Visit: Payer: Self-pay

## 2020-02-08 DIAGNOSIS — N6452 Nipple discharge: Secondary | ICD-10-CM

## 2020-02-08 IMAGING — MR MR BREAST BILAT WO/W CM
8 of 12 series · 31 of 48 positions shown · IV contrast (gadavist)
Comparison: Mammography [DATE]

CLINICAL DATA: The patient presented for mammography for bilateral
nipple discharge [DATE]. A group of calcifications was
biopsied demonstrating fibrocystic changes but no cause for
discharge noted. MRI was recommended.

LABS:  None
EXAM:
BILATERAL BREAST MRI WITH AND WITHOUT CONTRAST
TECHNIQUE: Multiplanar, multisequence MR images of both breasts were obtained
prior to and following the intravenous administration of 10 ml of
Gadavist

[Series 2: t2_tirm_tra ipat (a-p) · axial · 3.0mm · 0.78mm/px · 1 of 61 slices shown]
[im 1/61]
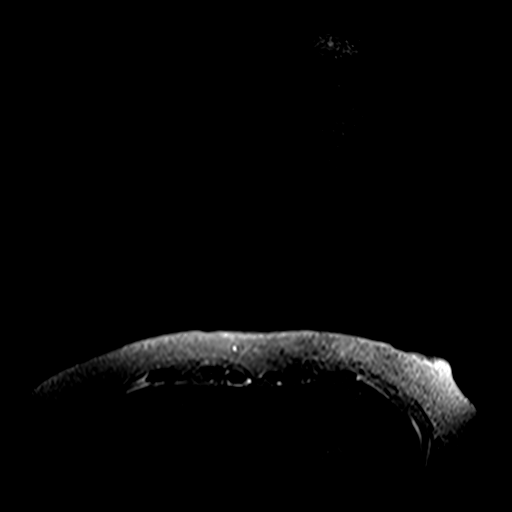

[Series 3: fl3d pre-cm no · axial · non-contrast · 1.2mm · 1.04mm/px · z∈[-52,+139]mm · 5 of 160 slices shown]
[im 1/160]
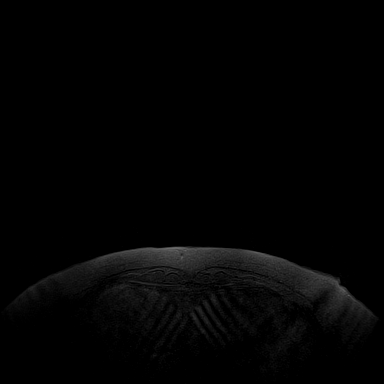
[im 40/160]
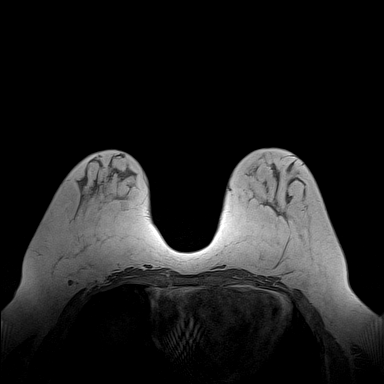
[im 80/160]
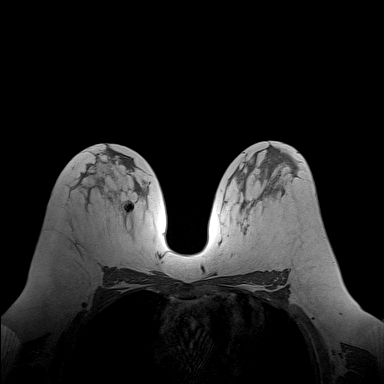
[im 120/160]
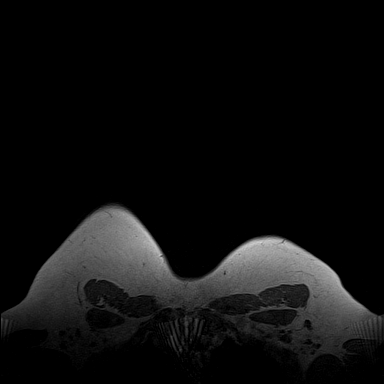
[im 160/160]
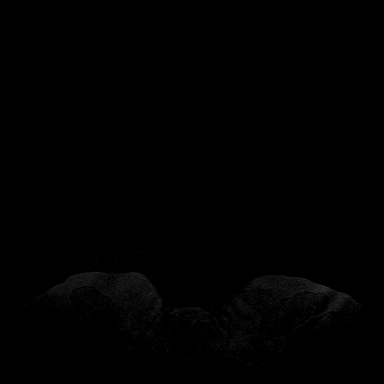

[Series 4: fl3d pre-cm · axial · non-contrast · 1.2mm · 1.04mm/px · z∈[-52,+139]mm · 5 of 160 slices shown]
[im 1/160]
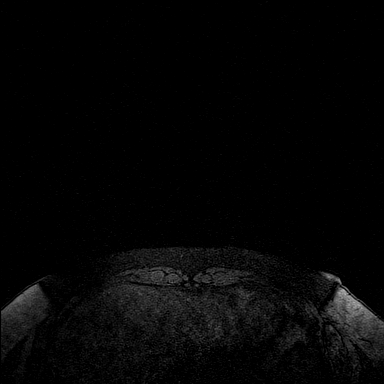
[im 40/160]
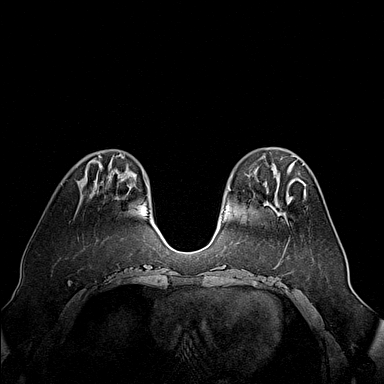
[im 80/160]
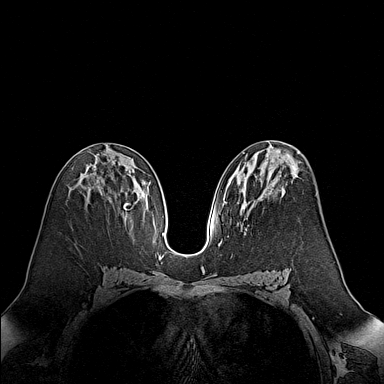
[im 120/160]
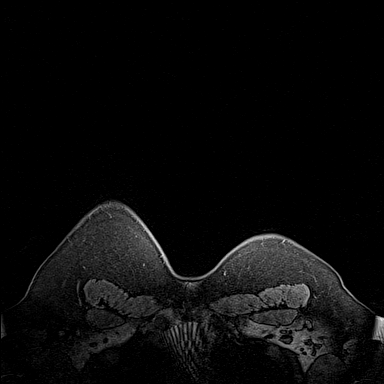
[im 160/160]
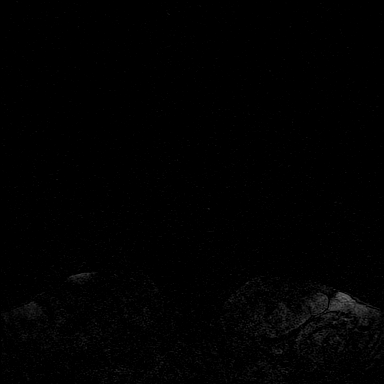

[Series 5: fl3d post-cm 20 · axial · 1.2mm · 1.04mm/px · z∈[-52,+139]mm · 5 of 160 slices shown (1 of 3)]
[im 1/160]
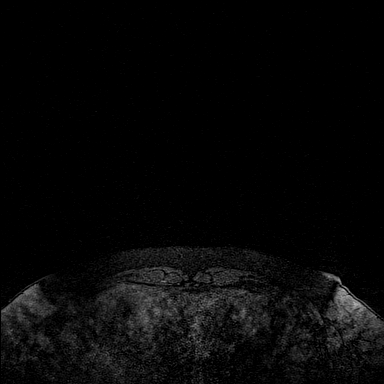
[im 40/160]
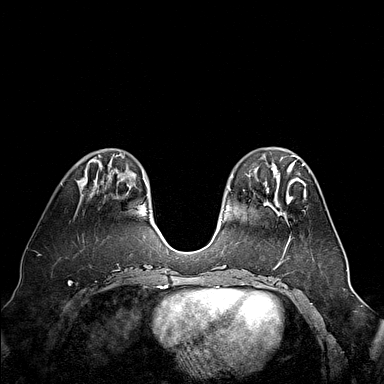
[im 80/160]
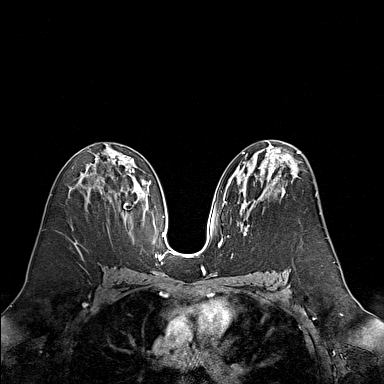
[im 120/160]
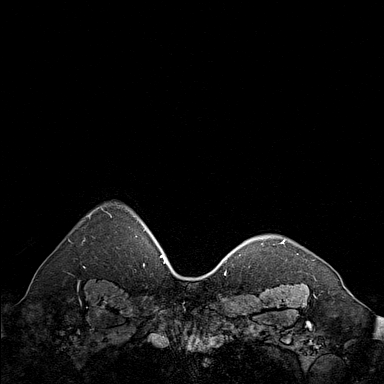
[im 160/160]
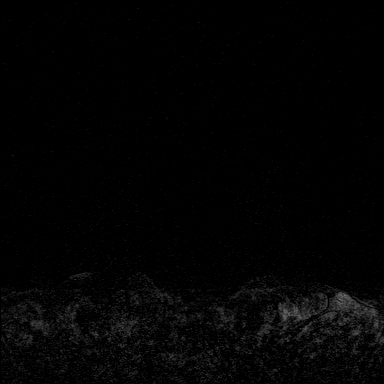

[Series 6: fl3d post-cm 20 · axial · 1.2mm · 1.04mm/px · z∈[-52,+139]mm · 5 of 160 slices shown (2 of 3)]
[im 1/160]
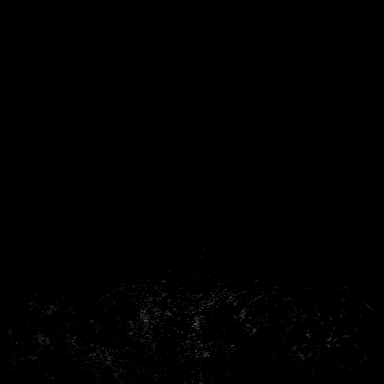
[im 40/160]
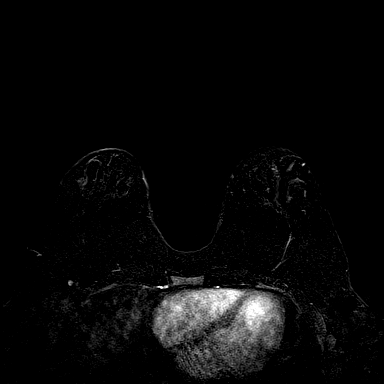
[im 80/160]
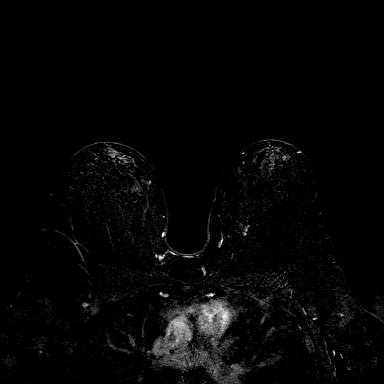
[im 120/160]
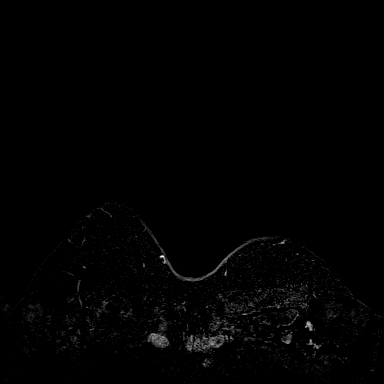
[im 160/160]
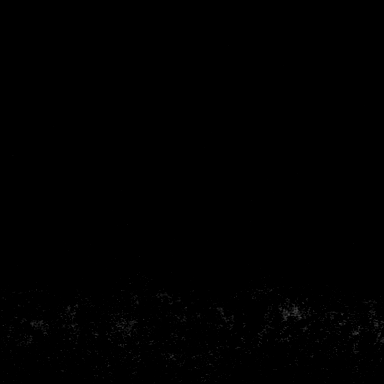

[Series 7: fl3d post-cm 20 · axial · 192.0mm · 1.04mm/px · 1 of 1 slices shown (3 of 3)]
[im 1/1]
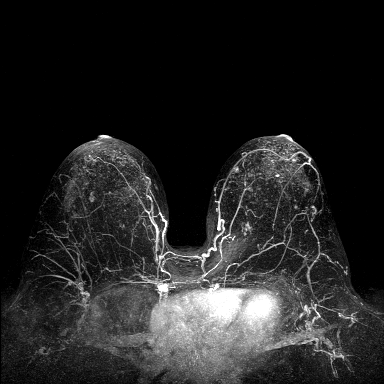

[Series 8: fl3d post-cm 3min · axial · 1.2mm · 1.04mm/px · z∈[-52,+139]mm · 6 of 160 slices shown]
[im 1/160]
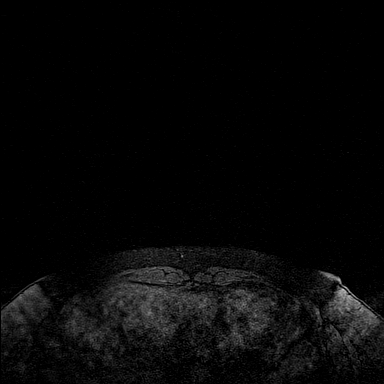
[im 32/160]
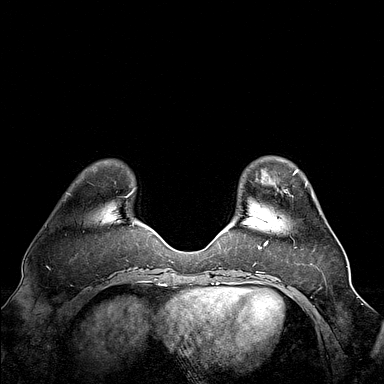
[im 64/160]
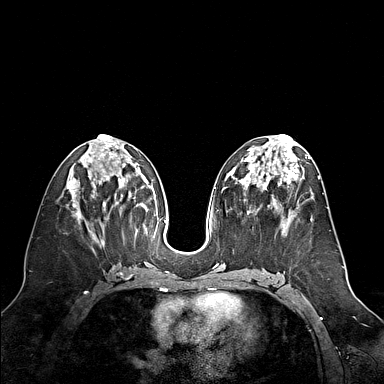
[im 96/160]
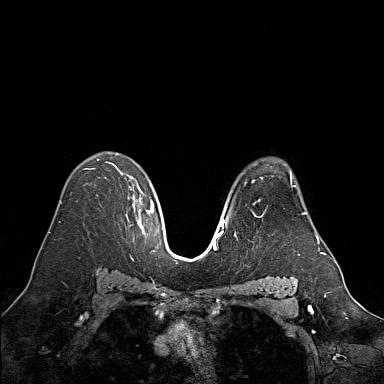
[im 128/160]
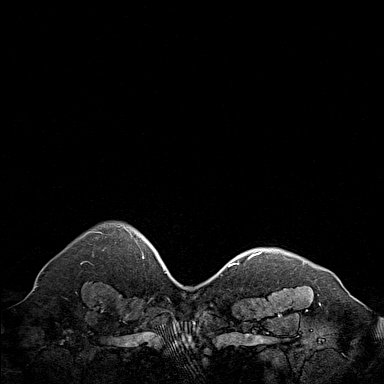
[im 160/160]
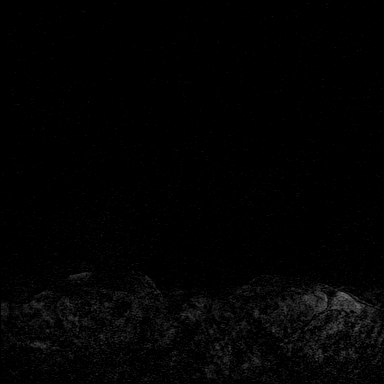

[Series 9: fl3d post-cm 3min_sub · axial · 1.2mm · 1.04mm/px · z∈[-52,+24]mm · 3 of 160 slices shown]
[im 1/160]
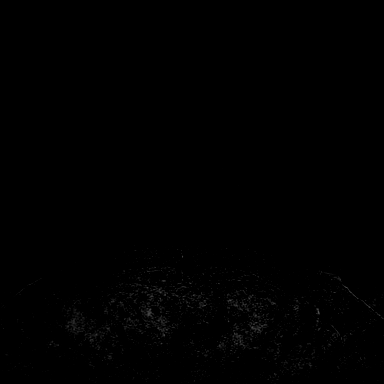
[im 32/160]
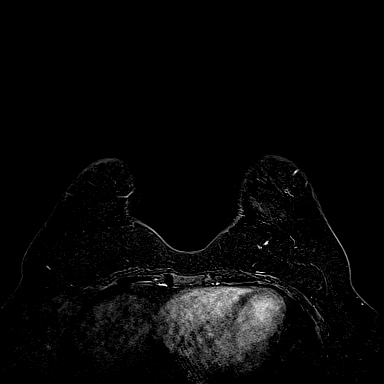
[im 64/160]
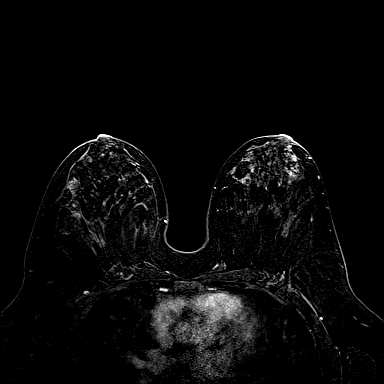

[31 of 48 positions shown; findings below may reference images not displayed]

Three-dimensional MR images were rendered by post-processing of the
original MR data on an independent workstation. The
three-dimensional MR images were interpreted, and findings are
reported in the following complete MRI report for this study. Three
dimensional images were evaluated at the independent interpreting
workstation using the DynaCAD thin client.
FINDINGS: Breast composition: c. Heterogeneous fibroglandular tissue.

Background parenchymal enhancement: Moderate.

Right breast: There is a mass in the lower outer right breast as
seen on series 6, image 117 measuring 1.2 cm with persistent
enhancement kinetics and associated high T2 signal. Multiple other
foci are seen on the right but no other discrete suspicious masses
are noted. No suspicious non mass enhancement.

Left breast: There is a cluster of masses in the upper inner left
breast seen on series 6, images 74 through 81. This cluster of
masses demonstrates persistent enhancement kinetics and associated
high T2 signal spanning up to 14.4 by 17.2 by 11.9 mm in
craniocaudal, AP, and transverse dimension. This cluster of masses
likely represents a single process. There is a 6 mm mass at 6
o'clock in the anterior left breast on series 6, image 110. While
this somewhat blends into the background on axial images, it is
noted to be a discrete small mass enhancing more than the
surrounding background on MIP imaging and on DynaCAD. No other
suspicious findings on the left.

Lymph nodes: No abnormal lymphadenopathy.

Ancillary findings:  No other abnormalities.
IMPRESSION: 1. 12 mm mass in the lower outer right breast on series 6, image
117.
2. Cluster of masses in the left breast located in the upper inner
quadrant on images 74-81 of series 6 spanning up to 14 x 17 x 12 mm.
3. 6 mm mass at 6 o'clock in the anterior left breast seen on series
6, image 110.

RECOMMENDATION:
Recommend MRI guided biopsy of the right breast mass in the lower
outer quadrant, the cluster of masses in the upper inner quadrant on
the left, and the 6 mm mass at 6 o'clock in the anterior left
breast. If none of these biopsies demonstrated cause for the
patient's nipple discharge, recommend surgical consultation.

BI-RADS CATEGORY  4: Suspicious.

## 2020-02-08 MED ORDER — GADOBUTROL 1 MMOL/ML IV SOLN
10.0000 mL | Freq: Once | INTRAVENOUS | Status: AC | PRN
  Administered 2020-02-08: 10 mL via INTRAVENOUS

## 2020-02-15 ENCOUNTER — Other Ambulatory Visit: Payer: Self-pay | Admitting: Obstetrics & Gynecology

## 2020-02-15 DIAGNOSIS — R9389 Abnormal findings on diagnostic imaging of other specified body structures: Secondary | ICD-10-CM

## 2020-02-21 ENCOUNTER — Other Ambulatory Visit (HOSPITAL_COMMUNITY): Payer: Self-pay | Admitting: Diagnostic Radiology

## 2020-02-21 ENCOUNTER — Other Ambulatory Visit: Payer: Self-pay

## 2020-02-21 ENCOUNTER — Ambulatory Visit
Admission: RE | Admit: 2020-02-21 | Discharge: 2020-02-21 | Disposition: A | Discharge status: Home / Self Care | Payer: BC Managed Care – PPO | Source: Ambulatory Visit | Attending: Obstetrics & Gynecology | Admitting: Obstetrics & Gynecology

## 2020-02-21 DIAGNOSIS — R9389 Abnormal findings on diagnostic imaging of other specified body structures: Secondary | ICD-10-CM

## 2020-02-21 IMAGING — MR MR BREAST BX W LOC DEV EA ADD LESION IMAGE BX SPEC MR GUIDE*L*
7 of 12 series · 28 of 48 positions shown · IV contrast (gadavist)
Comparison: Previous exams.
COMPARISON: Previous exams.

Addendum:
CLINICAL DATA: 34-year-old who presented with spontaneous BILATERAL
nipple discharge without mammographic or sonographic abnormality to
explain the discharge. She had a concordant benign stereotactic core
needle biopsy of calcifications in the UPPER INNER QUADRANT of the
RIGHT breast with pathology revealing fibrocystic changes.
TECHNIQUE: Multiplanar, multisequence MR imaging of both breasts was performed
both before and after administration of intravenous contrast.

CONTRAST:  10mL GADAVIST GADOBUTROL 1 MMOL/ML IV.

[Series 2: fiducial bilateral · sagittal · 2.0mm · 1.33mm/px · 4 of 144 slices shown]
[im 1/144]
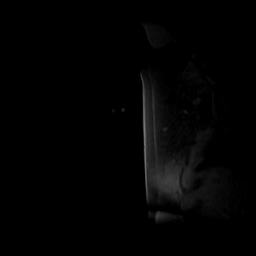
[im 48/144]
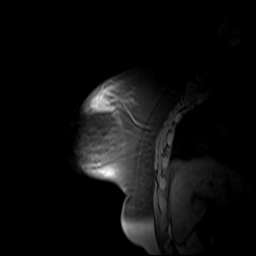
[im 96/144]
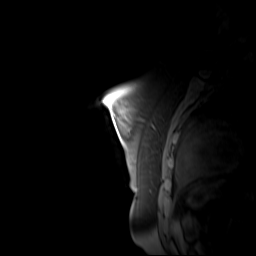
[im 144/144]
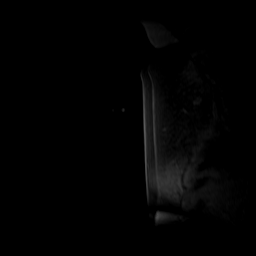

[Series 3: dynamic pre · axial · non-contrast · 1.3mm · 0.73mm/px · z∈[-59,+168]mm · 4 of 176 slices shown]
[im 1/176]
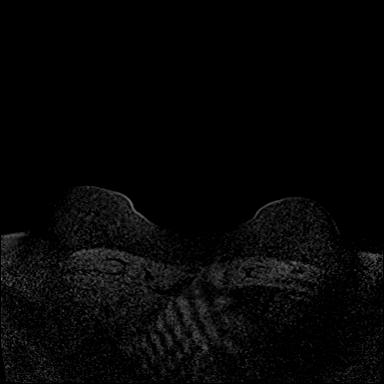
[im 59/176]
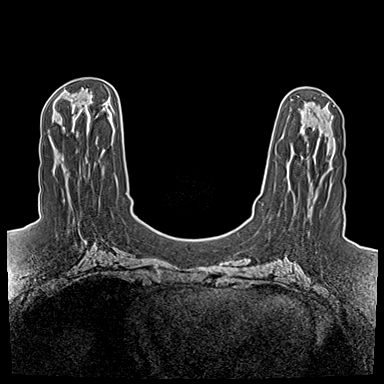
[im 117/176]
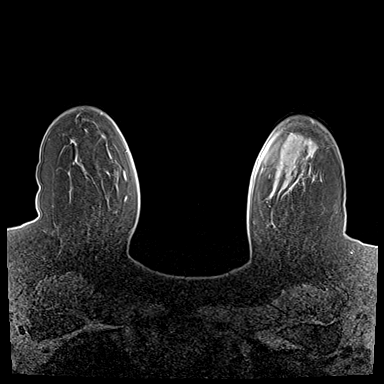
[im 176/176]
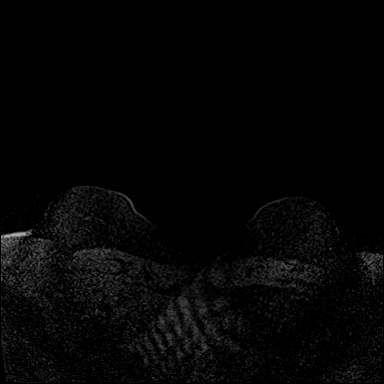

[Series 4: dynamic post 20 · axial · 1.3mm · 0.73mm/px · z∈[-59,+168]mm · 4 of 176 slices shown (1 of 2)]
[im 1/176]
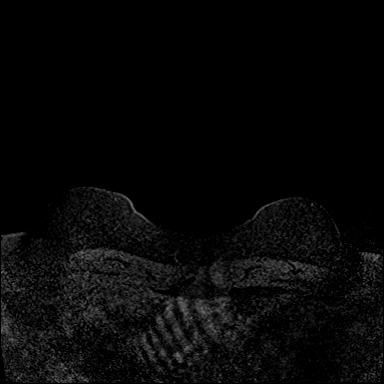
[im 59/176]
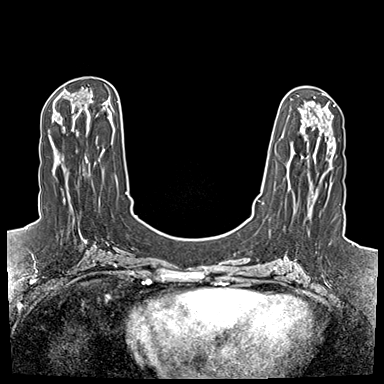
[im 117/176]
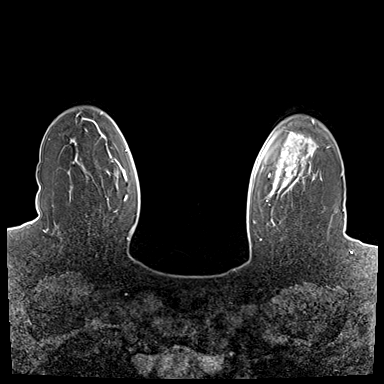
[im 176/176]
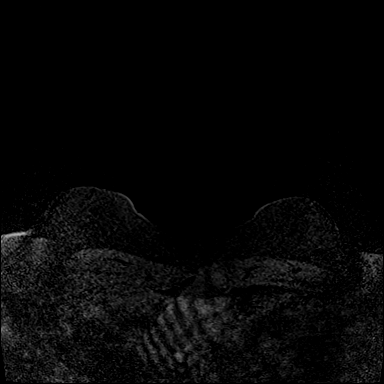

[Series 5: dynamic post 20 · axial · 1.3mm · 0.73mm/px · z∈[-59,+168]mm · 4 of 176 slices shown (2 of 2)]
[im 1/176]
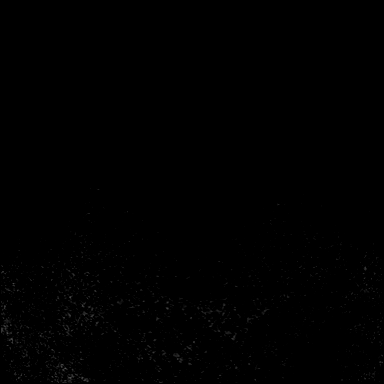
[im 59/176]
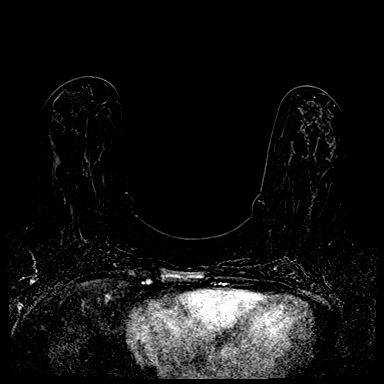
[im 117/176]
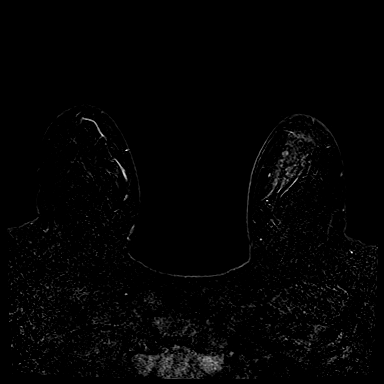
[im 176/176]
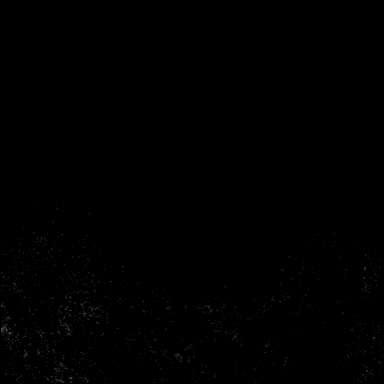

[Series 6: dynamic post 3 · axial · 1.3mm · 0.73mm/px · z∈[-59,+168]mm · 4 of 176 slices shown (1 of 2)]
[im 1/176]
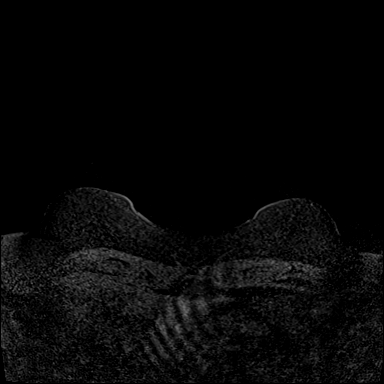
[im 59/176]
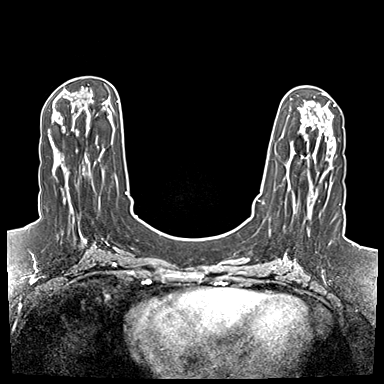
[im 117/176]
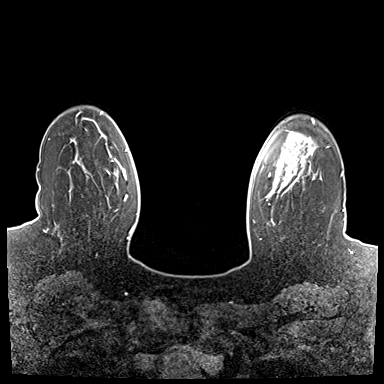
[im 176/176]
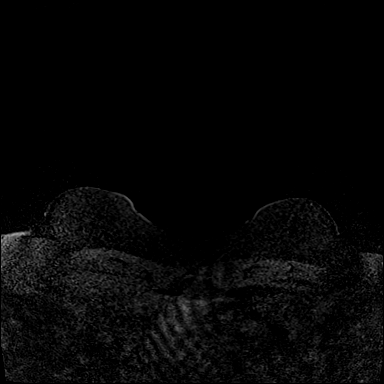

[Series 7: dynamic post 3 · axial · 1.3mm · 0.73mm/px · z∈[-59,+168]mm · 4 of 176 slices shown (2 of 2)]
[im 1/176]
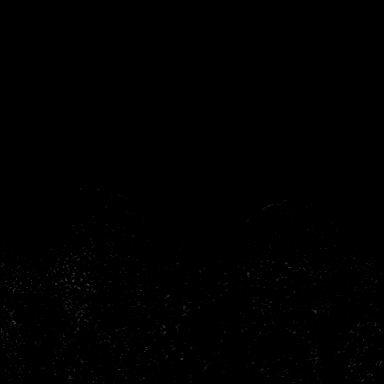
[im 59/176]
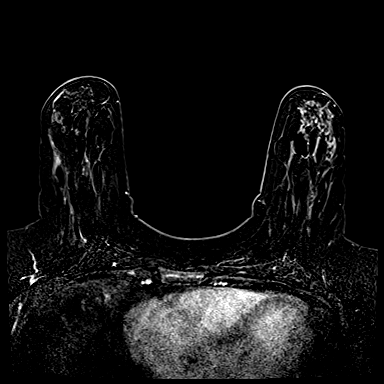
[im 117/176]
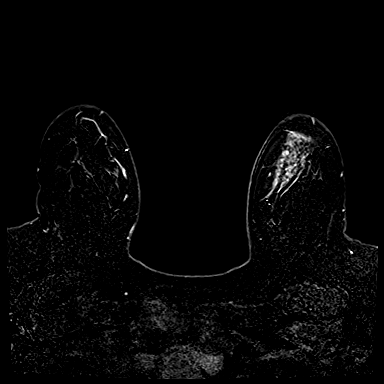
[im 176/176]
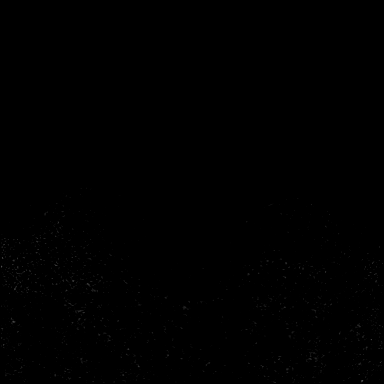

[Series 8: dynamic post 5 · axial · 1.3mm · 0.73mm/px · z∈[-59,+168]mm · 4 of 176 slices shown]
[im 1/176]
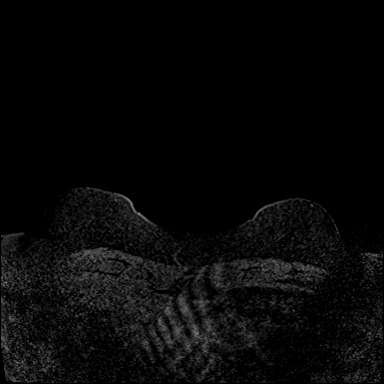
[im 59/176]
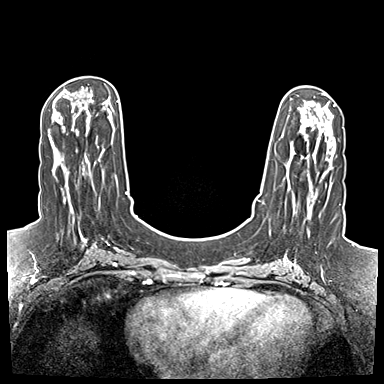
[im 117/176]
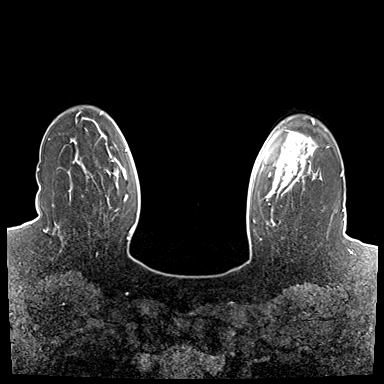
[im 176/176]
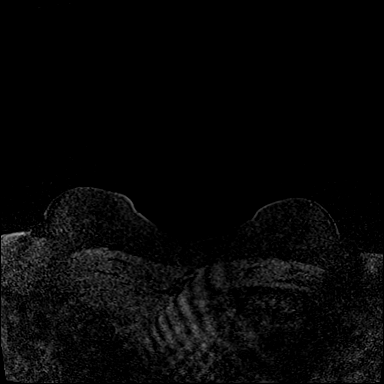

[28 of 48 positions shown; findings below may reference images not displayed]

Diagnostic MRI demonstrated a 1.2 cm mass in the LOWER OUTER
QUADRANT of the RIGHT breast, a cluster of masses versus non-mass
enhancement in the UPPER INNER QUADRANT of the LEFT breast spanning
1.7 cm, and a 0.6 cm mass in the LOWER LEFT breast at the 6 o'clock
location.

EXAM:
MRI GUIDED CORE NEEDLE BIOPSY OF THE LEFT BREAST X 2

MRI GUIDED CORE NEEDLE BIOPSY OF THE RIGHT BREAST
FINDINGS: I met with the patient, and we discussed the procedure of MRI guided
biopsy, including risks, benefits, and alternatives. Specifically,
we discussed the risks of infection, bleeding, tissue injury, clip
migration, and inadequate sampling. Informed, written consent was
given. The usual time out protocol was performed immediately prior
to the procedure.

# 1) LEFT breast, lesion quadrant: UPPER INNER QUADRANT.

Using sterile technique with chlorhexidine as skin antisepsis, 1%
lidocaine and 1% lidocaine with epinephrine as local anesthetic,
using MRI guidance, a 9 gauge vacuum assisted device was used
perform biopsy of the clustered masses versus non-mass enhancement
in the UPPER INNER QUADRANT of the LEFT breast at POSTERIOR depth
using a lateral approach. At the conclusion of the procedure, a
dumbbell shaped tissue marker clip was deployed into the biopsy
cavity.

# 2) LEFT breast, lesion quadrant: LOWER breast, near 6 o'clock
location.

Using sterile technique with chlorhexidine as skin antisepsis, 1%
lidocaine and 1% lidocaine with epinephrine as local anesthetic,
using MRI guidance, a 9 gauge vacuum assisted device was used
perform biopsy of the mass in the LOWER LEFT breast at the near 6
o'clock location using a lateral approach. At the conclusion of the
procedure, a cylinder shaped tissue marker clip was deployed into
the biopsy cavity.

# 3) RIGHT breast, lesion quadrant: LOWER OUTER QUADRANT.

Using sterile technique with chlorhexidine as skin antisepsis, 1%
lidocaine and 1% lidocaine with epinephrine as local anesthetic,
using MRI guidance, a 9 gauge vacuum assisted device was used
perform biopsy of the mass in the LOWER OUTER QUADRANT of the RIGHT
breast using a lateral approach. At the conclusion of the procedure,
a dumbbell shaped tissue marker clip was deployed into the biopsy
cavity.

Follow-up bilateral 2-view mammogram was performed and dictated
separately.
IMPRESSION: 1. MRI guided biopsy of clustered masses versus non-mass enhancement
in the UPPER INNER QUADRANT of the LEFT breast and a mass in the
LOWER LEFT breast at the near 6 o'clock location.
2. MRI guided biopsy of a mass in the LOWER OUTER QUADRANT of the
RIGHT breast.
3. No apparent complications.

ADDENDUM:
Pathology revealed PSEUDOANGIOMATOUS STROMAL HYPERPLASIA (PASH).
FIBROCYSTIC CHANGES of the LEFT breast, upper inner quadrant. This
was found to be concordant by Dr. KEDIJA.

Pathology revealed FIBROCYSTIC CHANGES of the LEFT breast, lower 6
o'clock. This was found to be concordant by Dr. KEDIJA.

Pathology revealed FIBROCYSTIC CHANGES WITH APOCRINE METAPLASIA of
the RIGHT breast, lower outer quadrant. This was found to be
concordant by Dr. KEDIJA.

Pathology results were discussed with the patient by telephone. The
patient reported doing well after the biopsies with tenderness at
the sites. Post biopsy instructions and care were reviewed and
questions were answered. The patient was encouraged to call The

Recommendation: Annual screening mammography to begin at age 42.
Surgical consultation if the nipple discharge becomes bothersome to
the patient to discuss central duct excision (after she has had all
the children she is going to have and after breast feeding of
course).

Pathology results reported by KEDIJA RN on [DATE].

*** End of Addendum ***
Diagnostic MRI demonstrated a 1.2 cm mass in the LOWER OUTER
QUADRANT of the RIGHT breast, a cluster of masses versus non-mass
enhancement in the UPPER INNER QUADRANT of the LEFT breast spanning
1.7 cm, and a 0.6 cm mass in the LOWER LEFT breast at the 6 o'clock
location.

EXAM:
MRI GUIDED CORE NEEDLE BIOPSY OF THE LEFT BREAST X 2

MRI GUIDED CORE NEEDLE BIOPSY OF THE RIGHT BREAST
FINDINGS: I met with the patient, and we discussed the procedure of MRI guided
biopsy, including risks, benefits, and alternatives. Specifically,
we discussed the risks of infection, bleeding, tissue injury, clip
migration, and inadequate sampling. Informed, written consent was
given. The usual time out protocol was performed immediately prior
to the procedure.

# 1) LEFT breast, lesion quadrant: UPPER INNER QUADRANT.

Using sterile technique with chlorhexidine as skin antisepsis, 1%
lidocaine and 1% lidocaine with epinephrine as local anesthetic,
using MRI guidance, a 9 gauge vacuum assisted device was used
perform biopsy of the clustered masses versus non-mass enhancement
in the UPPER INNER QUADRANT of the LEFT breast at POSTERIOR depth
using a lateral approach. At the conclusion of the procedure, a
dumbbell shaped tissue marker clip was deployed into the biopsy
cavity.

# 2) LEFT breast, lesion quadrant: LOWER breast, near 6 o'clock
location.

Using sterile technique with chlorhexidine as skin antisepsis, 1%
lidocaine and 1% lidocaine with epinephrine as local anesthetic,
using MRI guidance, a 9 gauge vacuum assisted device was used
perform biopsy of the mass in the LOWER LEFT breast at the near 6
o'clock location using a lateral approach. At the conclusion of the
procedure, a cylinder shaped tissue marker clip was deployed into
the biopsy cavity.

# 3) RIGHT breast, lesion quadrant: LOWER OUTER QUADRANT.

Using sterile technique with chlorhexidine as skin antisepsis, 1%
lidocaine and 1% lidocaine with epinephrine as local anesthetic,
using MRI guidance, a 9 gauge vacuum assisted device was used
perform biopsy of the mass in the LOWER OUTER QUADRANT of the RIGHT
breast using a lateral approach. At the conclusion of the procedure,
a dumbbell shaped tissue marker clip was deployed into the biopsy
cavity.

Follow-up bilateral 2-view mammogram was performed and dictated
separately.
IMPRESSION: 1. MRI guided biopsy of clustered masses versus non-mass enhancement
in the UPPER INNER QUADRANT of the LEFT breast and a mass in the
LOWER LEFT breast at the near 6 o'clock location.
2. MRI guided biopsy of a mass in the LOWER OUTER QUADRANT of the
RIGHT breast.
3. No apparent complications.

## 2020-02-21 IMAGING — MR MR BREAST BX W LOC DEV 1ST LESION IMAGE BX SPEC MR GUIDE*L*
7 of 12 series · 28 of 48 positions shown · IV contrast (10 ml Gadavist)
Comparison: Previous exams.
COMPARISON: Previous exams.

Addendum:
CLINICAL DATA: 34-year-old who presented with spontaneous BILATERAL
nipple discharge without mammographic or sonographic abnormality to
explain the discharge. She had a concordant benign stereotactic core
needle biopsy of calcifications in the UPPER INNER QUADRANT of the
RIGHT breast with pathology revealing fibrocystic changes.
TECHNIQUE: Multiplanar, multisequence MR imaging of both breasts was performed
both before and after administration of intravenous contrast.

CONTRAST:  10mL GADAVIST GADOBUTROL 1 MMOL/ML IV.

[Series 3: fiducial bilateral · sagittal · 2.0mm · 1.33mm/px · 4 of 144 slices shown]
[im 1/144]
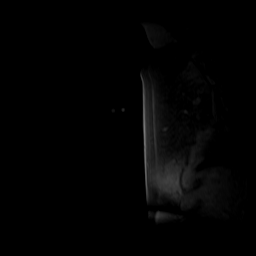
[im 48/144]
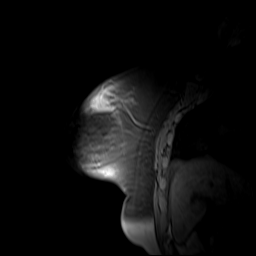
[im 96/144]
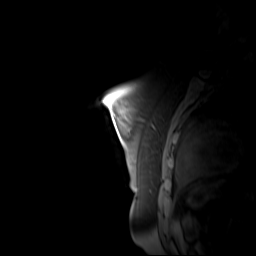
[im 144/144]
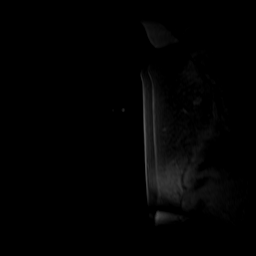

[Series 4: dynamic pre · axial · non-contrast · 1.3mm · 0.73mm/px · z∈[-59,+168]mm · 4 of 176 slices shown]
[im 1/176]
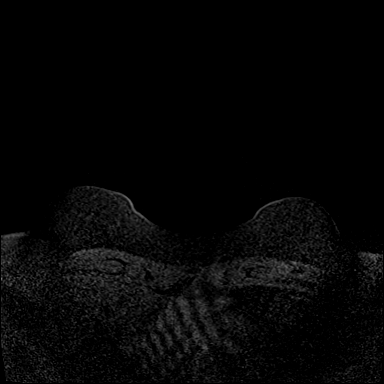
[im 59/176]
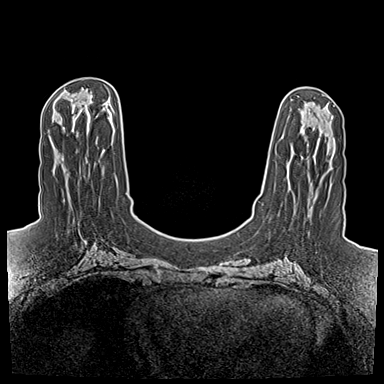
[im 117/176]
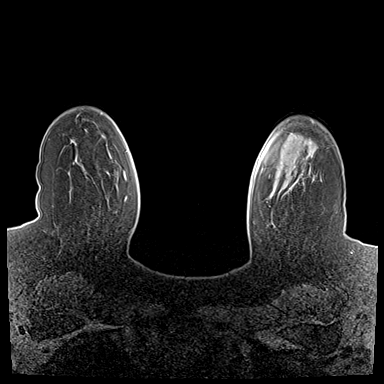
[im 176/176]
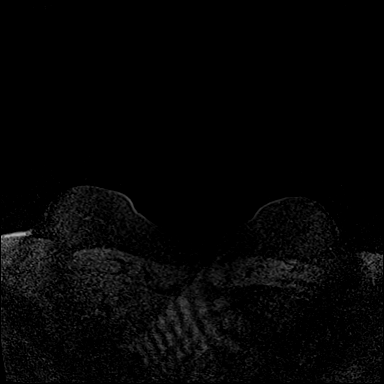

[Series 5: dynamic post 20 · axial · 1.3mm · 0.73mm/px · z∈[-59,+168]mm · 4 of 176 slices shown (1 of 2)]
[im 1/176]
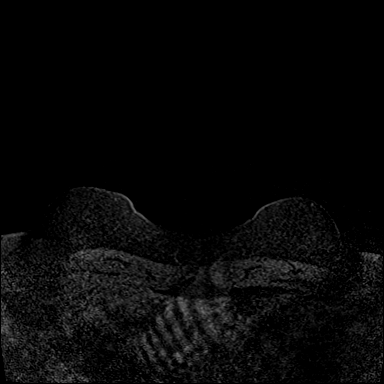
[im 59/176]
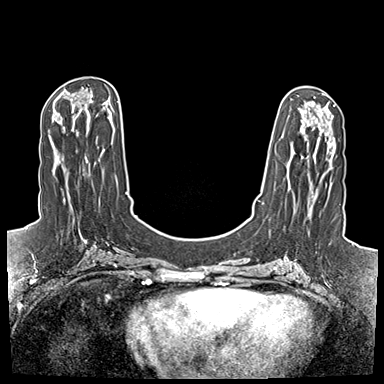
[im 117/176]
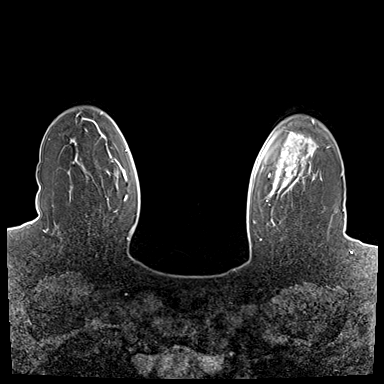
[im 176/176]
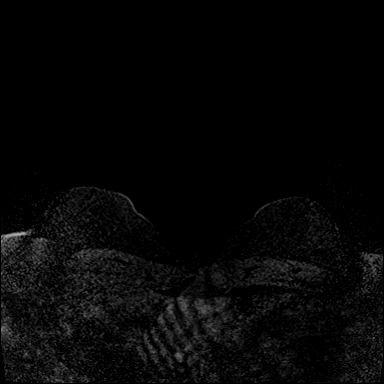

[Series 6: dynamic post 20 · axial · 1.3mm · 0.73mm/px · z∈[-59,+168]mm · 4 of 176 slices shown (2 of 2)]
[im 1/176]
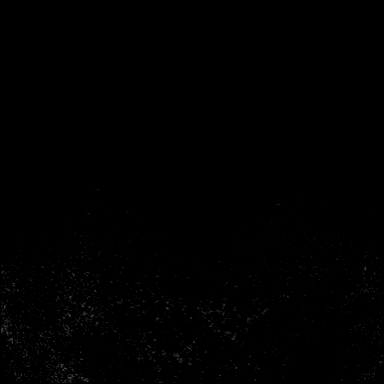
[im 59/176]
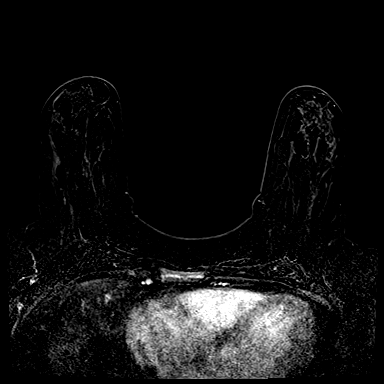
[im 117/176]
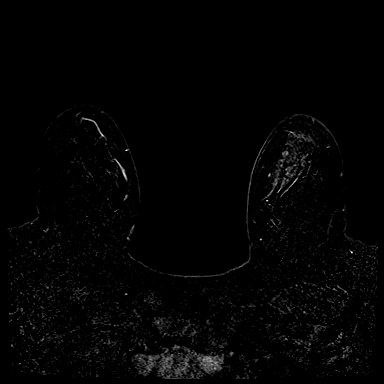
[im 176/176]
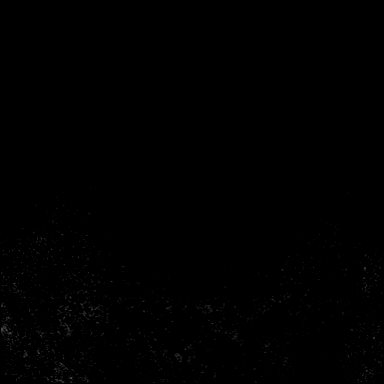

[Series 7: dynamic post 3 · axial · 1.3mm · 0.73mm/px · z∈[-59,+168]mm · 4 of 176 slices shown (1 of 2)]
[im 1/176]
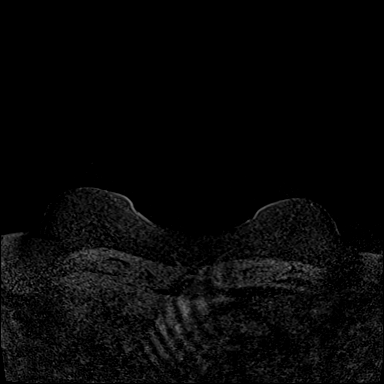
[im 59/176]
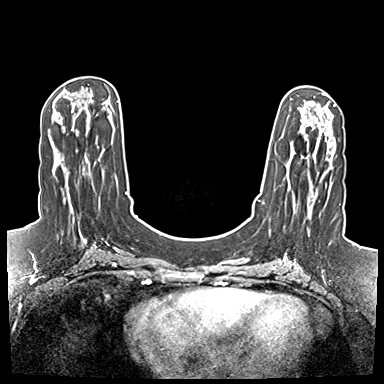
[im 117/176]
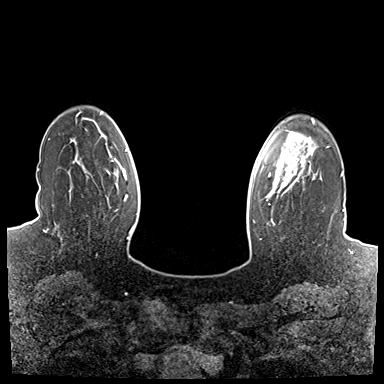
[im 176/176]
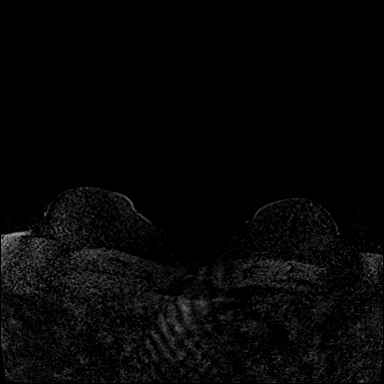

[Series 8: dynamic post 3 · axial · 1.3mm · 0.73mm/px · z∈[-59,+168]mm · 4 of 176 slices shown (2 of 2)]
[im 1/176]
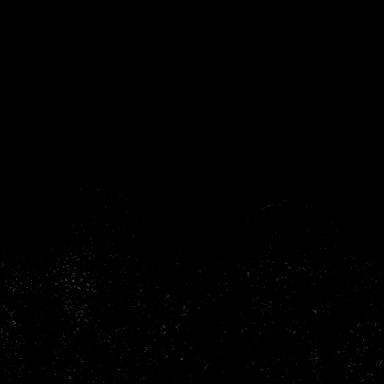
[im 59/176]
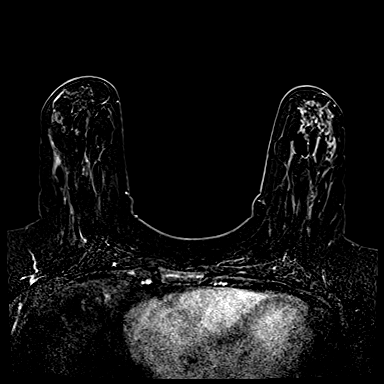
[im 117/176]
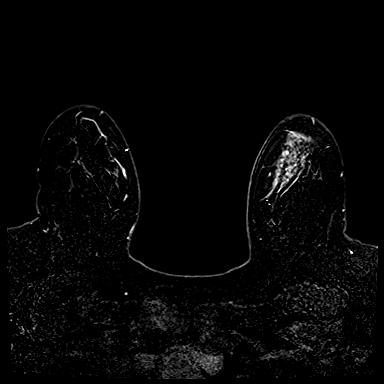
[im 176/176]
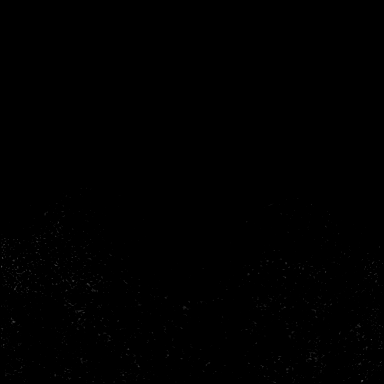

[Series 9: dynamic post 5 · axial · 1.3mm · 0.73mm/px · z∈[-59,+168]mm · 4 of 176 slices shown]
[im 1/176]
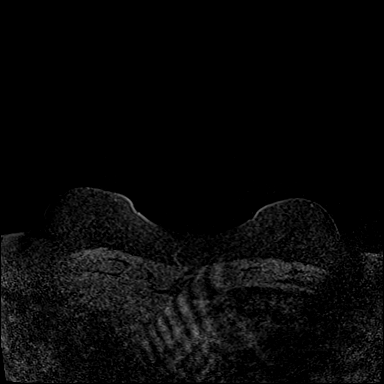
[im 59/176]
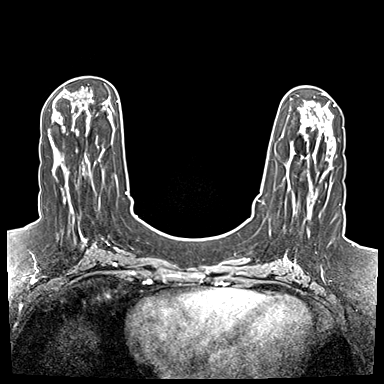
[im 117/176]
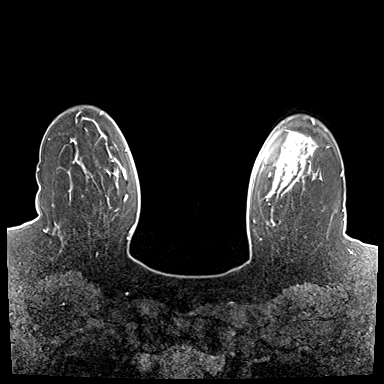
[im 176/176]
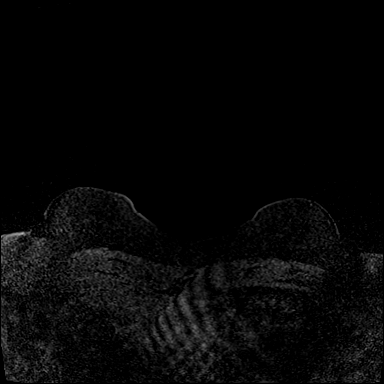

[28 of 48 positions shown; findings below may reference images not displayed]

Diagnostic MRI demonstrated a 1.2 cm mass in the LOWER OUTER
QUADRANT of the RIGHT breast, a cluster of masses versus non-mass
enhancement in the UPPER INNER QUADRANT of the LEFT breast spanning
1.7 cm, and a 0.6 cm mass in the LOWER LEFT breast at the 6 o'clock
location.

EXAM:
MRI GUIDED CORE NEEDLE BIOPSY OF THE LEFT BREAST X 2

MRI GUIDED CORE NEEDLE BIOPSY OF THE RIGHT BREAST
FINDINGS: I met with the patient, and we discussed the procedure of MRI guided
biopsy, including risks, benefits, and alternatives. Specifically,
we discussed the risks of infection, bleeding, tissue injury, clip
migration, and inadequate sampling. Informed, written consent was
given. The usual time out protocol was performed immediately prior
to the procedure.

# 1) LEFT breast, lesion quadrant: UPPER INNER QUADRANT.

Using sterile technique with chlorhexidine as skin antisepsis, 1%
lidocaine and 1% lidocaine with epinephrine as local anesthetic,
using MRI guidance, a 9 gauge vacuum assisted device was used
perform biopsy of the clustered masses versus non-mass enhancement
in the UPPER INNER QUADRANT of the LEFT breast at POSTERIOR depth
using a lateral approach. At the conclusion of the procedure, a
dumbbell shaped tissue marker clip was deployed into the biopsy
cavity.

# 2) LEFT breast, lesion quadrant: LOWER breast, near 6 o'clock
location.

Using sterile technique with chlorhexidine as skin antisepsis, 1%
lidocaine and 1% lidocaine with epinephrine as local anesthetic,
using MRI guidance, a 9 gauge vacuum assisted device was used
perform biopsy of the mass in the LOWER LEFT breast at the near 6
o'clock location using a lateral approach. At the conclusion of the
procedure, a cylinder shaped tissue marker clip was deployed into
the biopsy cavity.

# 3) RIGHT breast, lesion quadrant: LOWER OUTER QUADRANT.

Using sterile technique with chlorhexidine as skin antisepsis, 1%
lidocaine and 1% lidocaine with epinephrine as local anesthetic,
using MRI guidance, a 9 gauge vacuum assisted device was used
perform biopsy of the mass in the LOWER OUTER QUADRANT of the RIGHT
breast using a lateral approach. At the conclusion of the procedure,
a dumbbell shaped tissue marker clip was deployed into the biopsy
cavity.

Follow-up bilateral 2-view mammogram was performed and dictated
separately.
IMPRESSION: 1. MRI guided biopsy of clustered masses versus non-mass enhancement
in the UPPER INNER QUADRANT of the LEFT breast and a mass in the
LOWER LEFT breast at the near 6 o'clock location.
2. MRI guided biopsy of a mass in the LOWER OUTER QUADRANT of the
RIGHT breast.
3. No apparent complications.

ADDENDUM:
Pathology revealed PSEUDOANGIOMATOUS STROMAL HYPERPLASIA (PASH).
FIBROCYSTIC CHANGES of the LEFT breast, upper inner quadrant. This
was found to be concordant by Dr. KEDIJA.

Pathology revealed FIBROCYSTIC CHANGES of the LEFT breast, lower 6
o'clock. This was found to be concordant by Dr. KEDIJA.

Pathology revealed FIBROCYSTIC CHANGES WITH APOCRINE METAPLASIA of
the RIGHT breast, lower outer quadrant. This was found to be
concordant by Dr. KEDIJA.

Pathology results were discussed with the patient by telephone. The
patient reported doing well after the biopsies with tenderness at
the sites. Post biopsy instructions and care were reviewed and
questions were answered. The patient was encouraged to call The

Recommendation: Annual screening mammography to begin at age 42.
Surgical consultation if the nipple discharge becomes bothersome to
the patient to discuss central duct excision (after she has had all
the children she is going to have and after breast feeding of
course).

Pathology results reported by KEDIJA RN on [DATE].

*** End of Addendum ***
Diagnostic MRI demonstrated a 1.2 cm mass in the LOWER OUTER
QUADRANT of the RIGHT breast, a cluster of masses versus non-mass
enhancement in the UPPER INNER QUADRANT of the LEFT breast spanning
1.7 cm, and a 0.6 cm mass in the LOWER LEFT breast at the 6 o'clock
location.

EXAM:
MRI GUIDED CORE NEEDLE BIOPSY OF THE LEFT BREAST X 2

MRI GUIDED CORE NEEDLE BIOPSY OF THE RIGHT BREAST
FINDINGS: I met with the patient, and we discussed the procedure of MRI guided
biopsy, including risks, benefits, and alternatives. Specifically,
we discussed the risks of infection, bleeding, tissue injury, clip
migration, and inadequate sampling. Informed, written consent was
given. The usual time out protocol was performed immediately prior
to the procedure.

# 1) LEFT breast, lesion quadrant: UPPER INNER QUADRANT.

Using sterile technique with chlorhexidine as skin antisepsis, 1%
lidocaine and 1% lidocaine with epinephrine as local anesthetic,
using MRI guidance, a 9 gauge vacuum assisted device was used
perform biopsy of the clustered masses versus non-mass enhancement
in the UPPER INNER QUADRANT of the LEFT breast at POSTERIOR depth
using a lateral approach. At the conclusion of the procedure, a
dumbbell shaped tissue marker clip was deployed into the biopsy
cavity.

# 2) LEFT breast, lesion quadrant: LOWER breast, near 6 o'clock
location.

Using sterile technique with chlorhexidine as skin antisepsis, 1%
lidocaine and 1% lidocaine with epinephrine as local anesthetic,
using MRI guidance, a 9 gauge vacuum assisted device was used
perform biopsy of the mass in the LOWER LEFT breast at the near 6
o'clock location using a lateral approach. At the conclusion of the
procedure, a cylinder shaped tissue marker clip was deployed into
the biopsy cavity.

# 3) RIGHT breast, lesion quadrant: LOWER OUTER QUADRANT.

Using sterile technique with chlorhexidine as skin antisepsis, 1%
lidocaine and 1% lidocaine with epinephrine as local anesthetic,
using MRI guidance, a 9 gauge vacuum assisted device was used
perform biopsy of the mass in the LOWER OUTER QUADRANT of the RIGHT
breast using a lateral approach. At the conclusion of the procedure,
a dumbbell shaped tissue marker clip was deployed into the biopsy
cavity.

Follow-up bilateral 2-view mammogram was performed and dictated
separately.
IMPRESSION: 1. MRI guided biopsy of clustered masses versus non-mass enhancement
in the UPPER INNER QUADRANT of the LEFT breast and a mass in the
LOWER LEFT breast at the near 6 o'clock location.
2. MRI guided biopsy of a mass in the LOWER OUTER QUADRANT of the
RIGHT breast.
3. No apparent complications.

## 2020-02-21 IMAGING — MG MM BREAST LOCALIZATION CLIP
4 series · 4 of 12 positions shown · non-contrast
Comparison: Previous exam(s).

CLINICAL DATA: Confirmation of clip placement after MRI guided
biopsies of the LEFT breast x2 and of the RIGHT breast x 1.

EXAM:
DIGITAL 2D AND TOMOSYNTHESIS DIAGNOSTIC BILATERAL MAMMOGRAM POST MRI
BIOPSY

[L ML synth-2D]
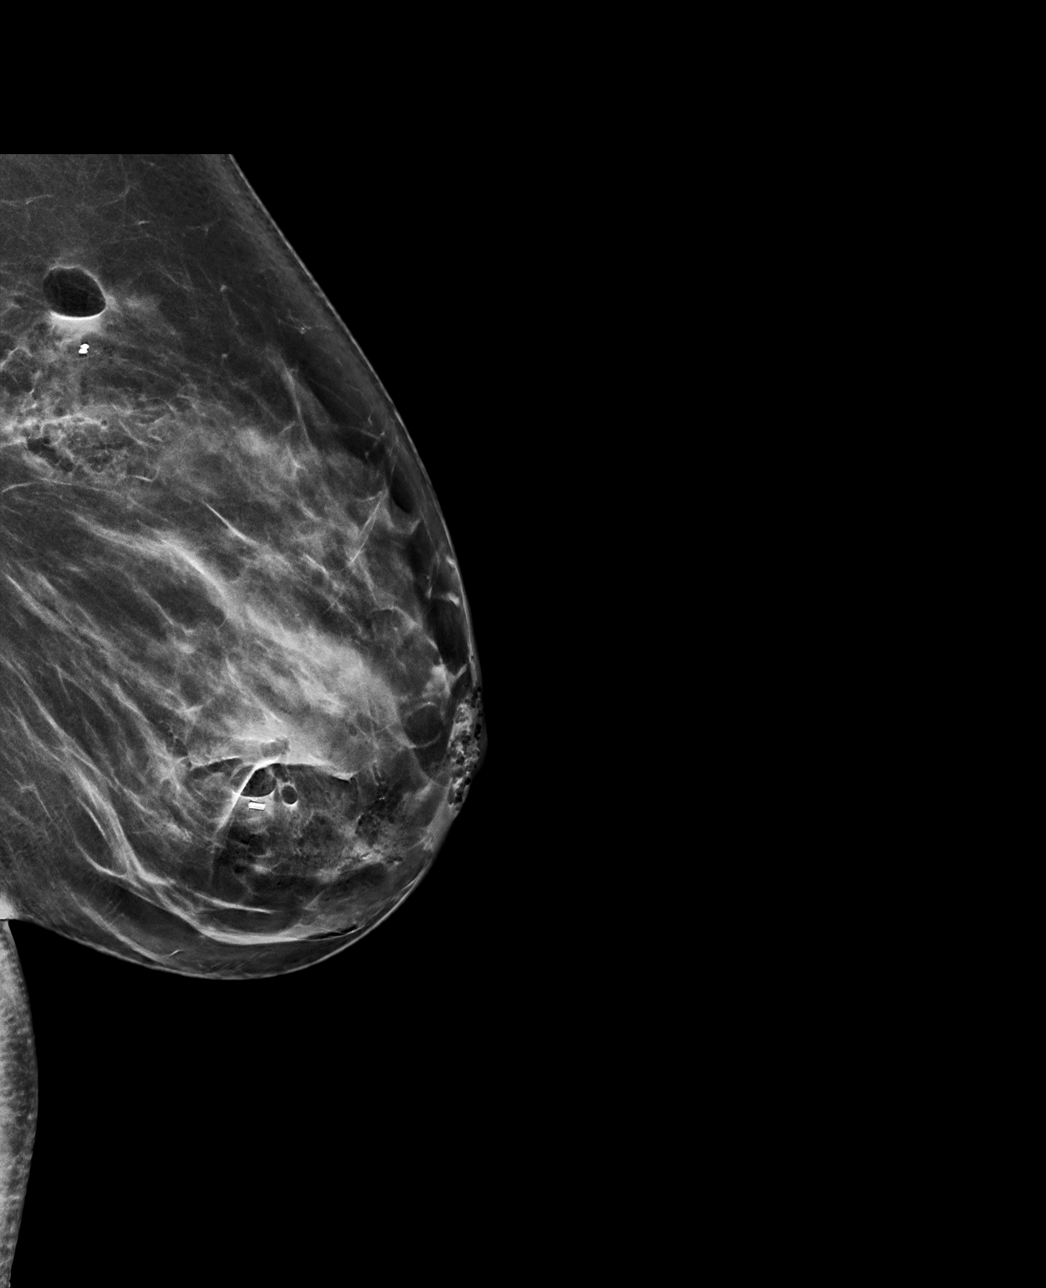

[L CC synth-2D]
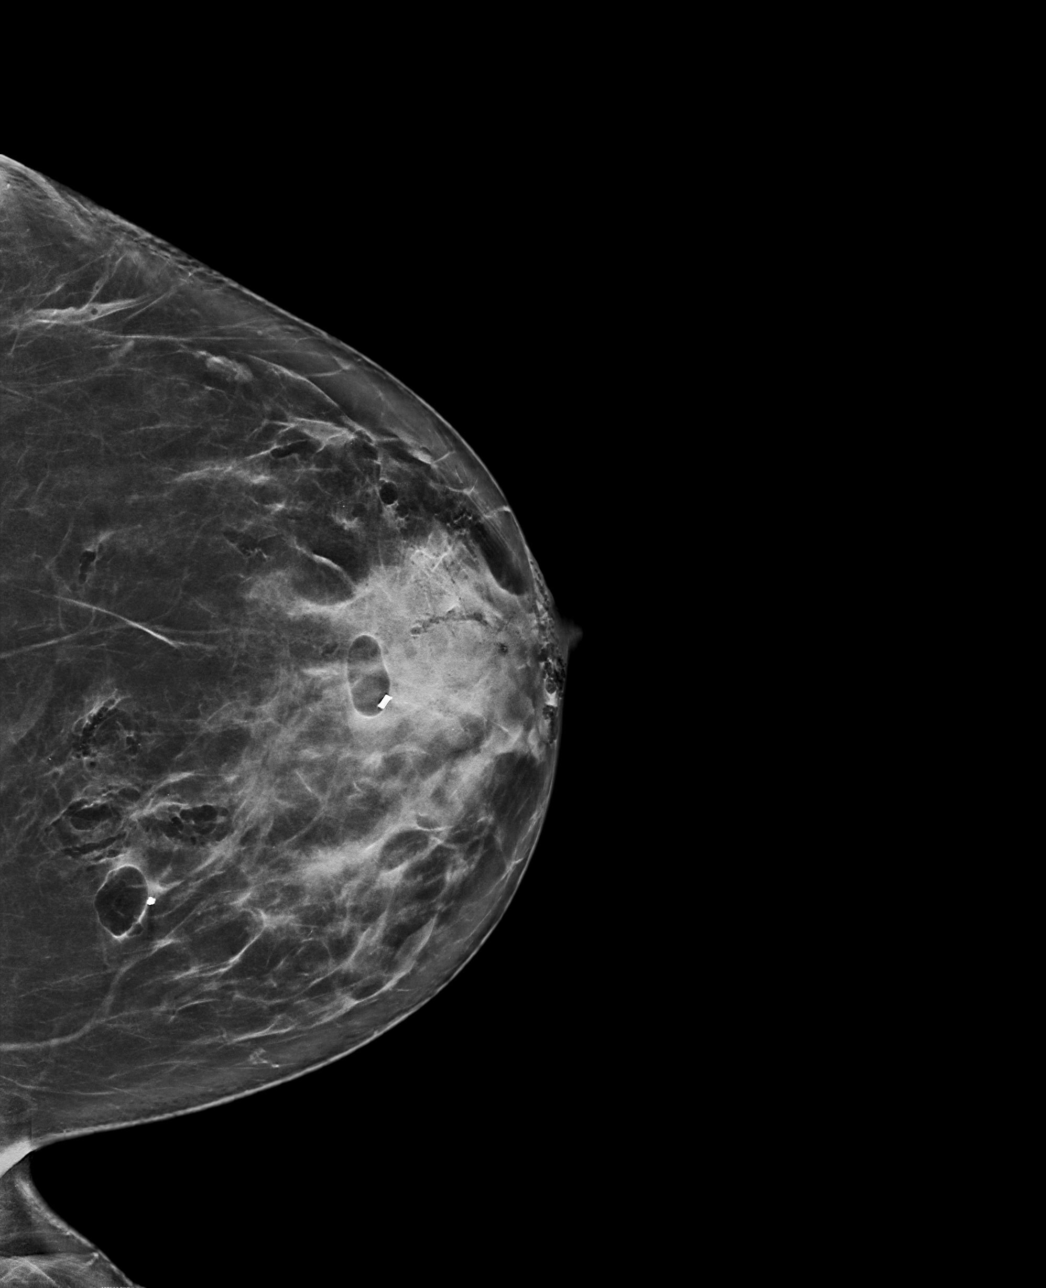

[L ML tomo · tomo slice 47/92.0]
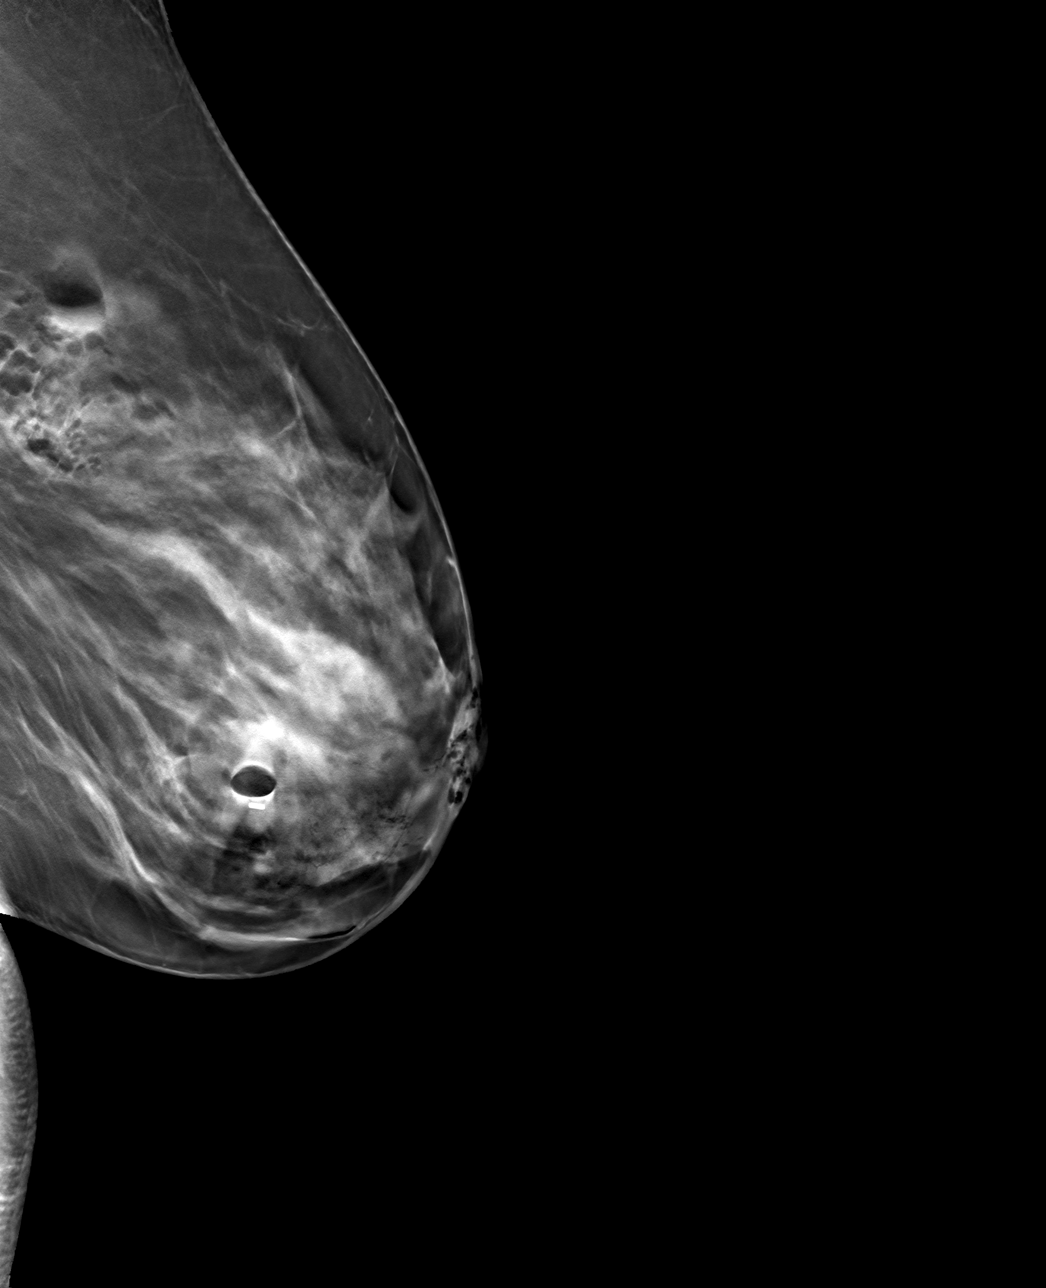

[L CC tomo · tomo slice 39/78.0]
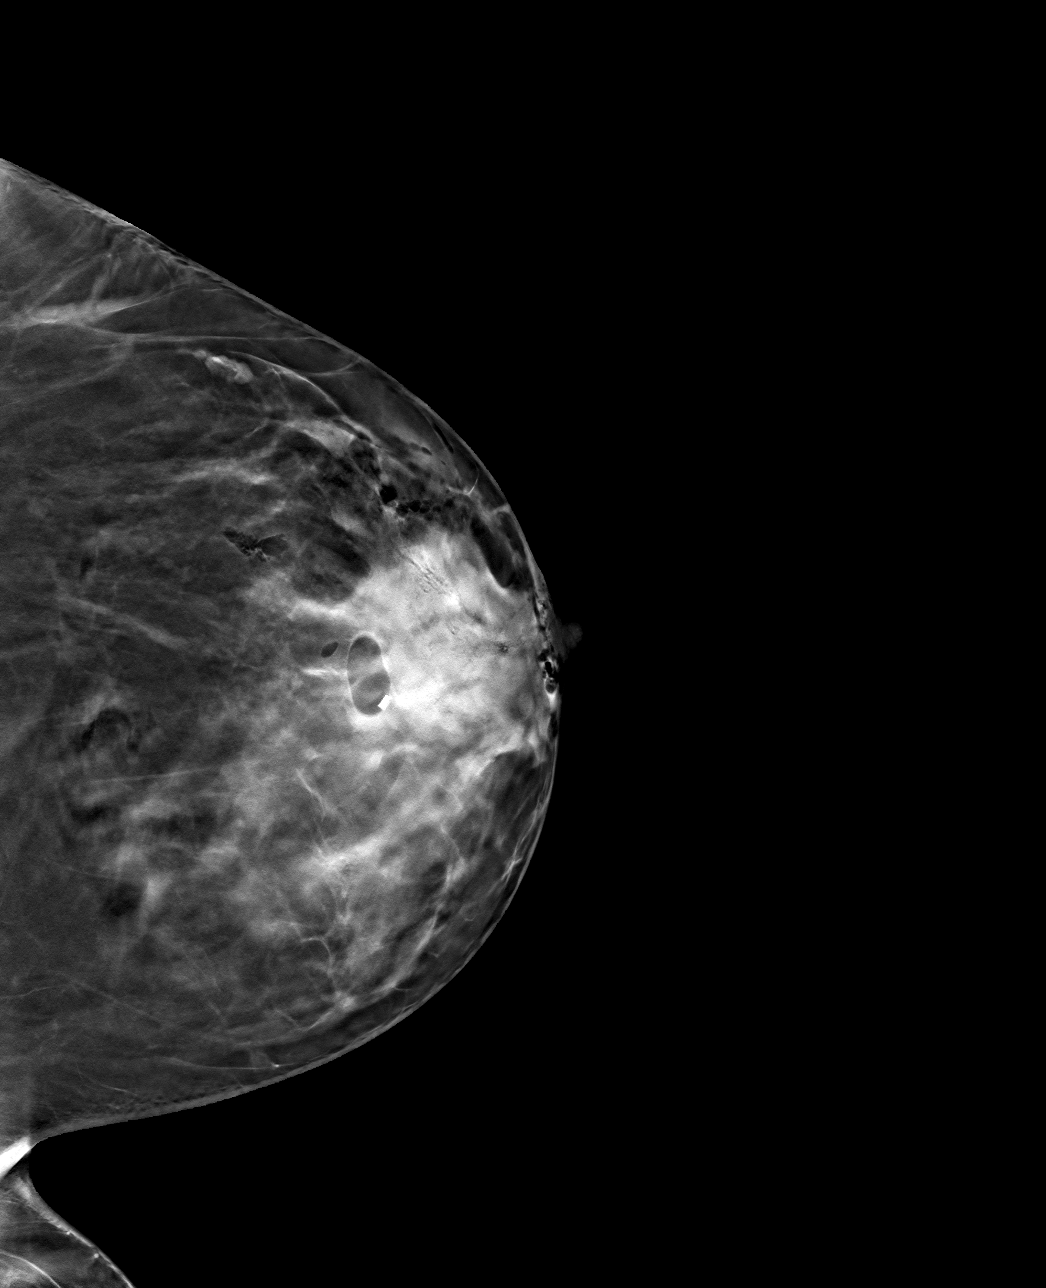

[4 of 12 positions shown; findings below may reference images not displayed]

FINDINGS: Tomosynthesis and synthesized digital 2D full field CC and
mediolateral images of both breasts were obtained following MRI
guided biopsy of 2 lesions in the LEFT breast and a single lesion in
the RIGHT breast.

LEFT: The dumbbell-shaped tissue marking clip is appropriately
position at the site of the biopsied clustered masses versus
non-mass enhancement in the UPPER INNER QUADRANT at POSTERIOR depth.

The cylinder shaped tissue marker clip is appropriately positioned
at the site of the biopsied mass at the near 6 o'clock position at
ANTERIOR to MIDDLE depth.

RIGHT: The dumbbell-shaped tissue marker clip migrated approximately
1 cm medially in the biopsy cavity relative to the biopsied mass in
the LOWER OUTER QUADRANT at MIDDLE depth.

Expected post biopsy changes are present at all 3 sites without
evidence of hematoma.
IMPRESSION: 1. Appropriate positioning of the dumbbell shaped tissue marking
clip at the site of the biopsied clustered masses versus NME in the
UPPER INNER QUADRANT of the LEFT breast at POSTERIOR depth.
2. Appropriate positioning of the cylinder shaped tissue marking
clip at the site of the biopsied mass in the LOWER LEFT breast at
the near 6 o'clock position.
3. Approximate 1 cm MEDIAL migration of the dumbbell-shaped tissue
marker clip relative to the biopsied mass in the LOWER OUTER
QUADRANT of the RIGHT breast at MIDDLE depth.

Final Assessment: Post Procedure Mammograms for Marker Placement

## 2020-02-21 IMAGING — MR MR BREAST BX W/ LOC DEV 1ST LEASION IMAGE BX SPEC MR GUIDE*R*
7 of 12 series · 28 of 48 positions shown · IV contrast (10 ml Gadavist)
Comparison: Previous exams.
COMPARISON: Previous exams.

Addendum:
CLINICAL DATA: 34-year-old who presented with spontaneous BILATERAL
nipple discharge without mammographic or sonographic abnormality to
explain the discharge. She had a concordant benign stereotactic core
needle biopsy of calcifications in the UPPER INNER QUADRANT of the
RIGHT breast with pathology revealing fibrocystic changes.
TECHNIQUE: Multiplanar, multisequence MR imaging of both breasts was performed
both before and after administration of intravenous contrast.

CONTRAST:  10mL GADAVIST GADOBUTROL 1 MMOL/ML IV.

[Series 2: fiducial bilateral · sagittal · 2.0mm · 1.33mm/px · 4 of 144 slices shown]
[im 1/144]
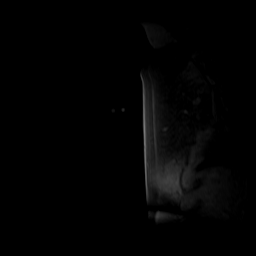
[im 48/144]
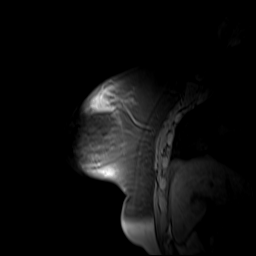
[im 96/144]
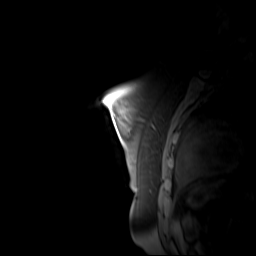
[im 144/144]
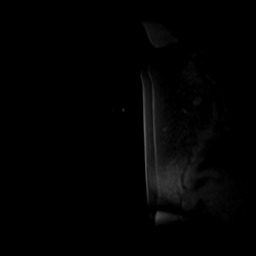

[Series 3: dynamic pre · axial · non-contrast · 1.3mm · 0.73mm/px · z∈[-59,+168]mm · 4 of 176 slices shown]
[im 1/176]
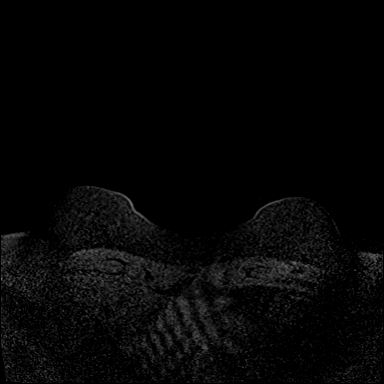
[im 59/176]
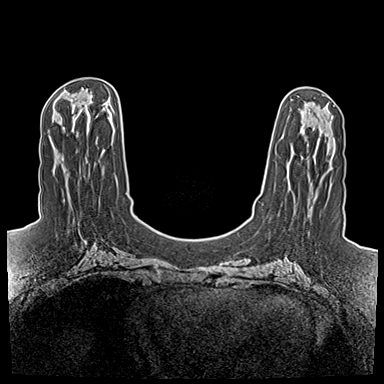
[im 117/176]
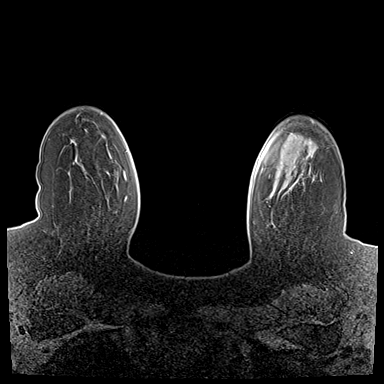
[im 176/176]
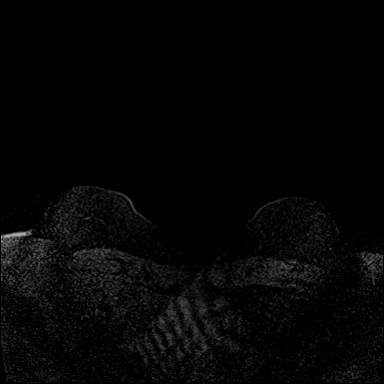

[Series 4: dynamic post 20 · axial · 1.3mm · 0.73mm/px · z∈[-59,+168]mm · 4 of 176 slices shown (1 of 2)]
[im 1/176]
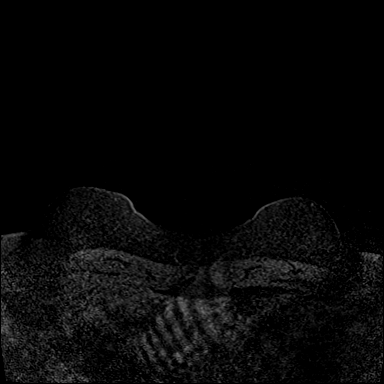
[im 59/176]
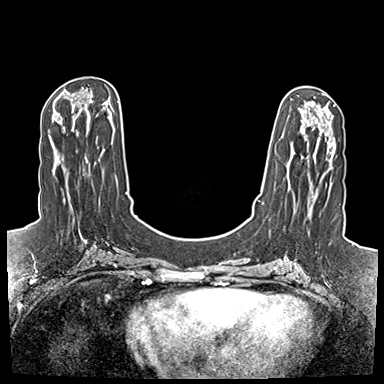
[im 117/176]
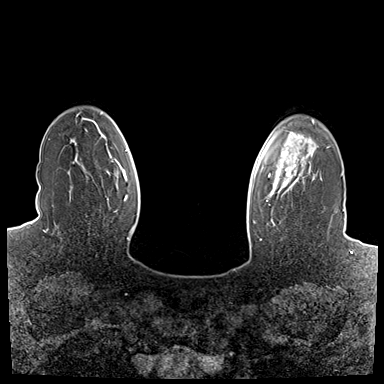
[im 176/176]
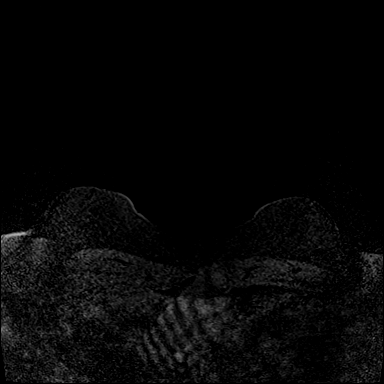

[Series 5: dynamic post 20 · axial · 1.3mm · 0.73mm/px · z∈[-59,+168]mm · 4 of 176 slices shown (2 of 2)]
[im 1/176]
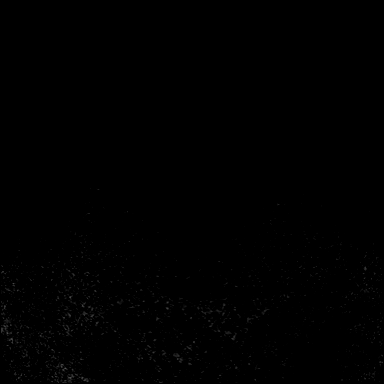
[im 59/176]
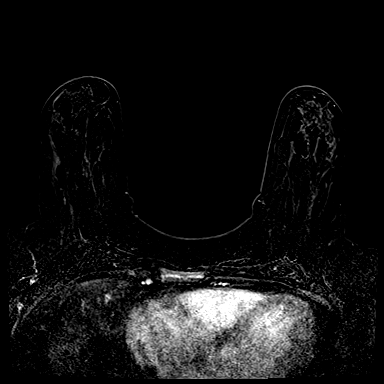
[im 117/176]
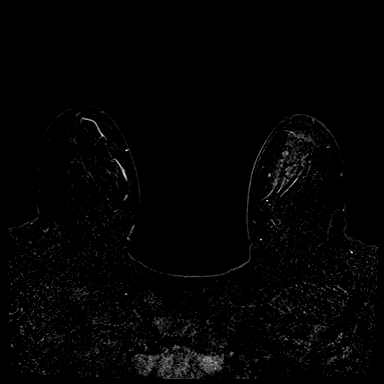
[im 176/176]
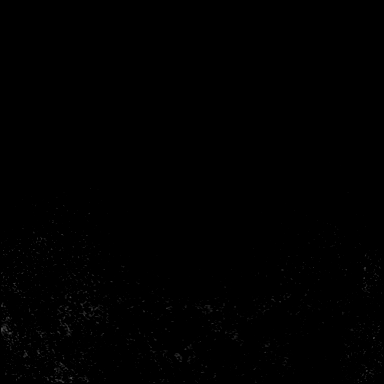

[Series 6: dynamic post 3 · axial · 1.3mm · 0.73mm/px · z∈[-59,+168]mm · 4 of 176 slices shown (1 of 2)]
[im 1/176]
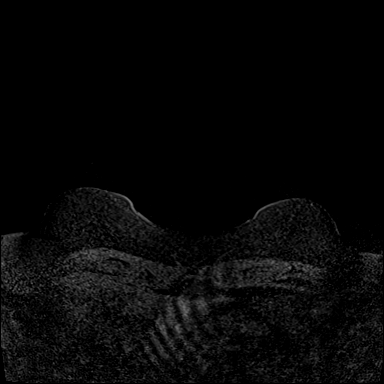
[im 59/176]
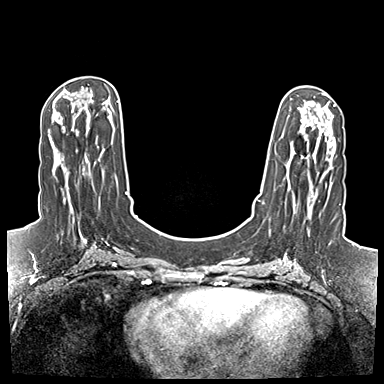
[im 117/176]
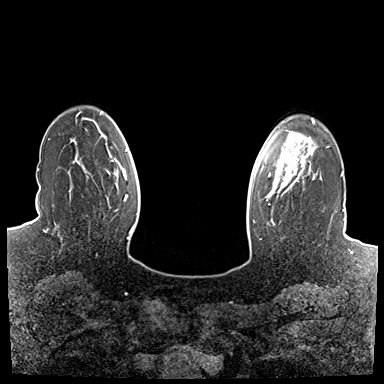
[im 176/176]
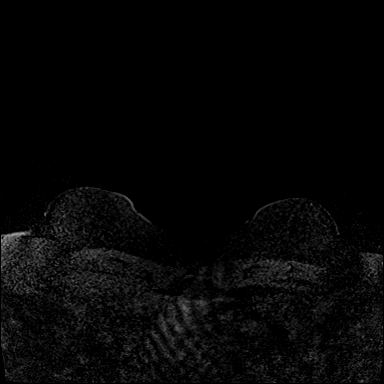

[Series 7: dynamic post 3 · axial · 1.3mm · 0.73mm/px · z∈[-59,+168]mm · 4 of 176 slices shown (2 of 2)]
[im 1/176]
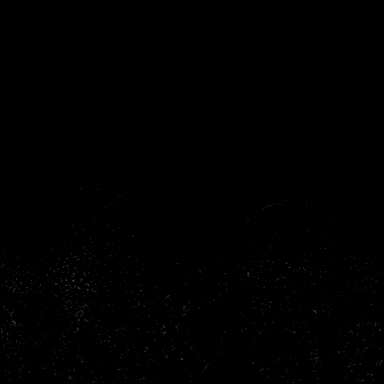
[im 59/176]
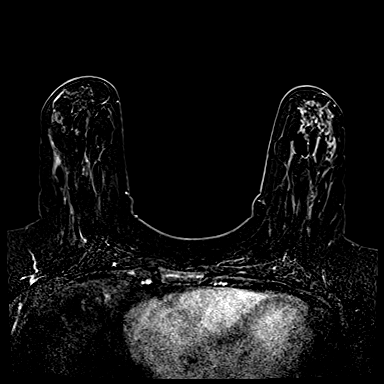
[im 117/176]
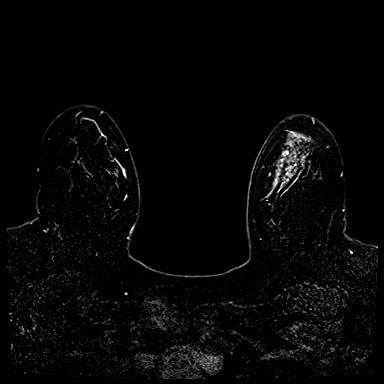
[im 176/176]
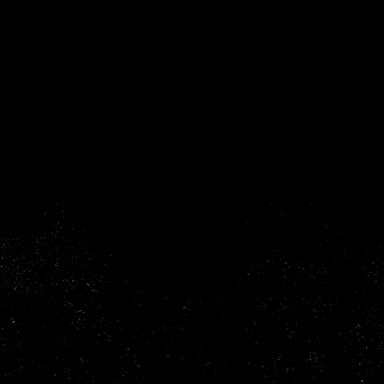

[Series 8: dynamic post 5 · axial · 1.3mm · 0.73mm/px · z∈[-59,+168]mm · 4 of 176 slices shown]
[im 1/176]
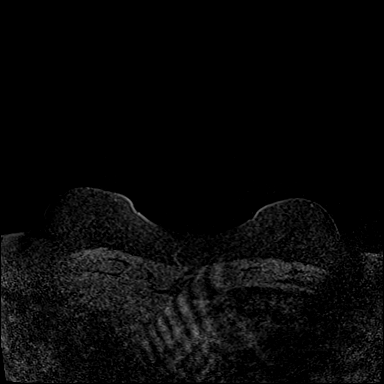
[im 59/176]
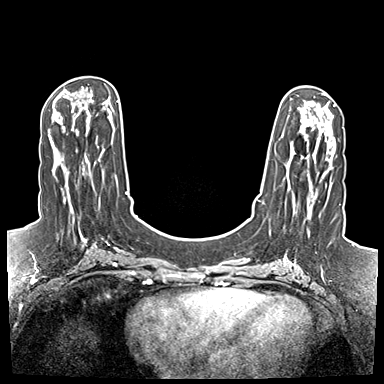
[im 117/176]
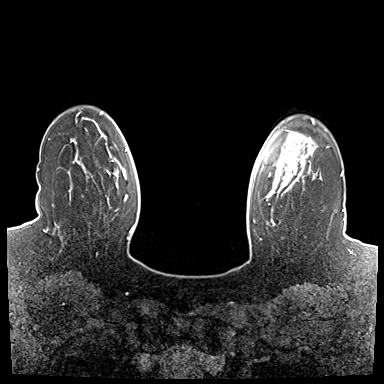
[im 176/176]
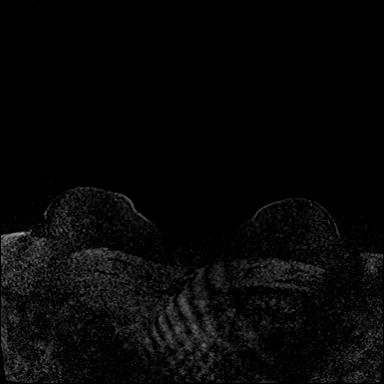

[28 of 48 positions shown; findings below may reference images not displayed]

Diagnostic MRI demonstrated a 1.2 cm mass in the LOWER OUTER
QUADRANT of the RIGHT breast, a cluster of masses versus non-mass
enhancement in the UPPER INNER QUADRANT of the LEFT breast spanning
1.7 cm, and a 0.6 cm mass in the LOWER LEFT breast at the 6 o'clock
location.

EXAM:
MRI GUIDED CORE NEEDLE BIOPSY OF THE LEFT BREAST X 2

MRI GUIDED CORE NEEDLE BIOPSY OF THE RIGHT BREAST
FINDINGS: I met with the patient, and we discussed the procedure of MRI guided
biopsy, including risks, benefits, and alternatives. Specifically,
we discussed the risks of infection, bleeding, tissue injury, clip
migration, and inadequate sampling. Informed, written consent was
given. The usual time out protocol was performed immediately prior
to the procedure.

# 1) LEFT breast, lesion quadrant: UPPER INNER QUADRANT.

Using sterile technique with chlorhexidine as skin antisepsis, 1%
lidocaine and 1% lidocaine with epinephrine as local anesthetic,
using MRI guidance, a 9 gauge vacuum assisted device was used
perform biopsy of the clustered masses versus non-mass enhancement
in the UPPER INNER QUADRANT of the LEFT breast at POSTERIOR depth
using a lateral approach. At the conclusion of the procedure, a
dumbbell shaped tissue marker clip was deployed into the biopsy
cavity.

# 2) LEFT breast, lesion quadrant: LOWER breast, near 6 o'clock
location.

Using sterile technique with chlorhexidine as skin antisepsis, 1%
lidocaine and 1% lidocaine with epinephrine as local anesthetic,
using MRI guidance, a 9 gauge vacuum assisted device was used
perform biopsy of the mass in the LOWER LEFT breast at the near 6
o'clock location using a lateral approach. At the conclusion of the
procedure, a cylinder shaped tissue marker clip was deployed into
the biopsy cavity.

# 3) RIGHT breast, lesion quadrant: LOWER OUTER QUADRANT.

Using sterile technique with chlorhexidine as skin antisepsis, 1%
lidocaine and 1% lidocaine with epinephrine as local anesthetic,
using MRI guidance, a 9 gauge vacuum assisted device was used
perform biopsy of the mass in the LOWER OUTER QUADRANT of the RIGHT
breast using a lateral approach. At the conclusion of the procedure,
a dumbbell shaped tissue marker clip was deployed into the biopsy
cavity.

Follow-up bilateral 2-view mammogram was performed and dictated
separately.
IMPRESSION: 1. MRI guided biopsy of clustered masses versus non-mass enhancement
in the UPPER INNER QUADRANT of the LEFT breast and a mass in the
LOWER LEFT breast at the near 6 o'clock location.
2. MRI guided biopsy of a mass in the LOWER OUTER QUADRANT of the
RIGHT breast.
3. No apparent complications.

ADDENDUM:
Pathology revealed PSEUDOANGIOMATOUS STROMAL HYPERPLASIA (PASH).
FIBROCYSTIC CHANGES of the LEFT breast, upper inner quadrant. This
was found to be concordant by Dr. KEDIJA.

Pathology revealed FIBROCYSTIC CHANGES of the LEFT breast, lower 6
o'clock. This was found to be concordant by Dr. KEDIJA.

Pathology revealed FIBROCYSTIC CHANGES WITH APOCRINE METAPLASIA of
the RIGHT breast, lower outer quadrant. This was found to be
concordant by Dr. KEDIJA.

Pathology results were discussed with the patient by telephone. The
patient reported doing well after the biopsies with tenderness at
the sites. Post biopsy instructions and care were reviewed and
questions were answered. The patient was encouraged to call The

Recommendation: Annual screening mammography to begin at age 42.
Surgical consultation if the nipple discharge becomes bothersome to
the patient to discuss central duct excision (after she has had all
the children she is going to have and after breast feeding of
course).

Pathology results reported by KEDIJA RN on [DATE].

*** End of Addendum ***
Diagnostic MRI demonstrated a 1.2 cm mass in the LOWER OUTER
QUADRANT of the RIGHT breast, a cluster of masses versus non-mass
enhancement in the UPPER INNER QUADRANT of the LEFT breast spanning
1.7 cm, and a 0.6 cm mass in the LOWER LEFT breast at the 6 o'clock
location.

EXAM:
MRI GUIDED CORE NEEDLE BIOPSY OF THE LEFT BREAST X 2

MRI GUIDED CORE NEEDLE BIOPSY OF THE RIGHT BREAST
FINDINGS: I met with the patient, and we discussed the procedure of MRI guided
biopsy, including risks, benefits, and alternatives. Specifically,
we discussed the risks of infection, bleeding, tissue injury, clip
migration, and inadequate sampling. Informed, written consent was
given. The usual time out protocol was performed immediately prior
to the procedure.

# 1) LEFT breast, lesion quadrant: UPPER INNER QUADRANT.

Using sterile technique with chlorhexidine as skin antisepsis, 1%
lidocaine and 1% lidocaine with epinephrine as local anesthetic,
using MRI guidance, a 9 gauge vacuum assisted device was used
perform biopsy of the clustered masses versus non-mass enhancement
in the UPPER INNER QUADRANT of the LEFT breast at POSTERIOR depth
using a lateral approach. At the conclusion of the procedure, a
dumbbell shaped tissue marker clip was deployed into the biopsy
cavity.

# 2) LEFT breast, lesion quadrant: LOWER breast, near 6 o'clock
location.

Using sterile technique with chlorhexidine as skin antisepsis, 1%
lidocaine and 1% lidocaine with epinephrine as local anesthetic,
using MRI guidance, a 9 gauge vacuum assisted device was used
perform biopsy of the mass in the LOWER LEFT breast at the near 6
o'clock location using a lateral approach. At the conclusion of the
procedure, a cylinder shaped tissue marker clip was deployed into
the biopsy cavity.

# 3) RIGHT breast, lesion quadrant: LOWER OUTER QUADRANT.

Using sterile technique with chlorhexidine as skin antisepsis, 1%
lidocaine and 1% lidocaine with epinephrine as local anesthetic,
using MRI guidance, a 9 gauge vacuum assisted device was used
perform biopsy of the mass in the LOWER OUTER QUADRANT of the RIGHT
breast using a lateral approach. At the conclusion of the procedure,
a dumbbell shaped tissue marker clip was deployed into the biopsy
cavity.

Follow-up bilateral 2-view mammogram was performed and dictated
separately.
IMPRESSION: 1. MRI guided biopsy of clustered masses versus non-mass enhancement
in the UPPER INNER QUADRANT of the LEFT breast and a mass in the
LOWER LEFT breast at the near 6 o'clock location.
2. MRI guided biopsy of a mass in the LOWER OUTER QUADRANT of the
RIGHT breast.
3. No apparent complications.

## 2020-02-21 IMAGING — MG MM BREAST LOCALIZATION CLIP
4 series · 4 of 12 positions shown · non-contrast
Comparison: Previous exam(s).

CLINICAL DATA: Confirmation of clip placement after MRI guided
biopsies of the LEFT breast x2 and of the RIGHT breast x 1.

EXAM:
DIGITAL 2D AND TOMOSYNTHESIS DIAGNOSTIC BILATERAL MAMMOGRAM POST MRI
BIOPSY

[R ML synth-2D]
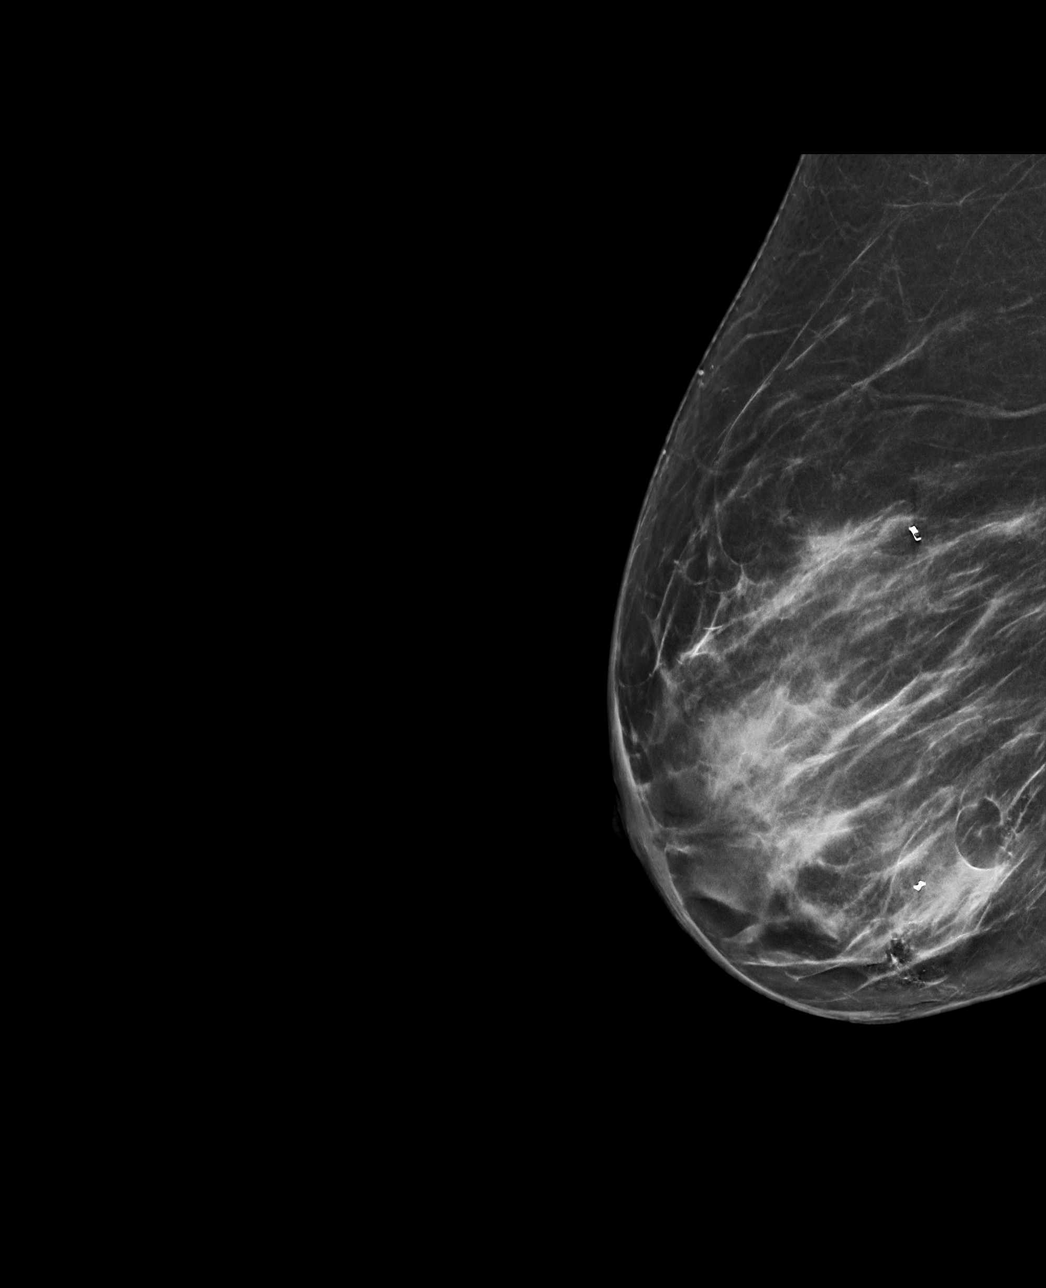

[R CC synth-2D]
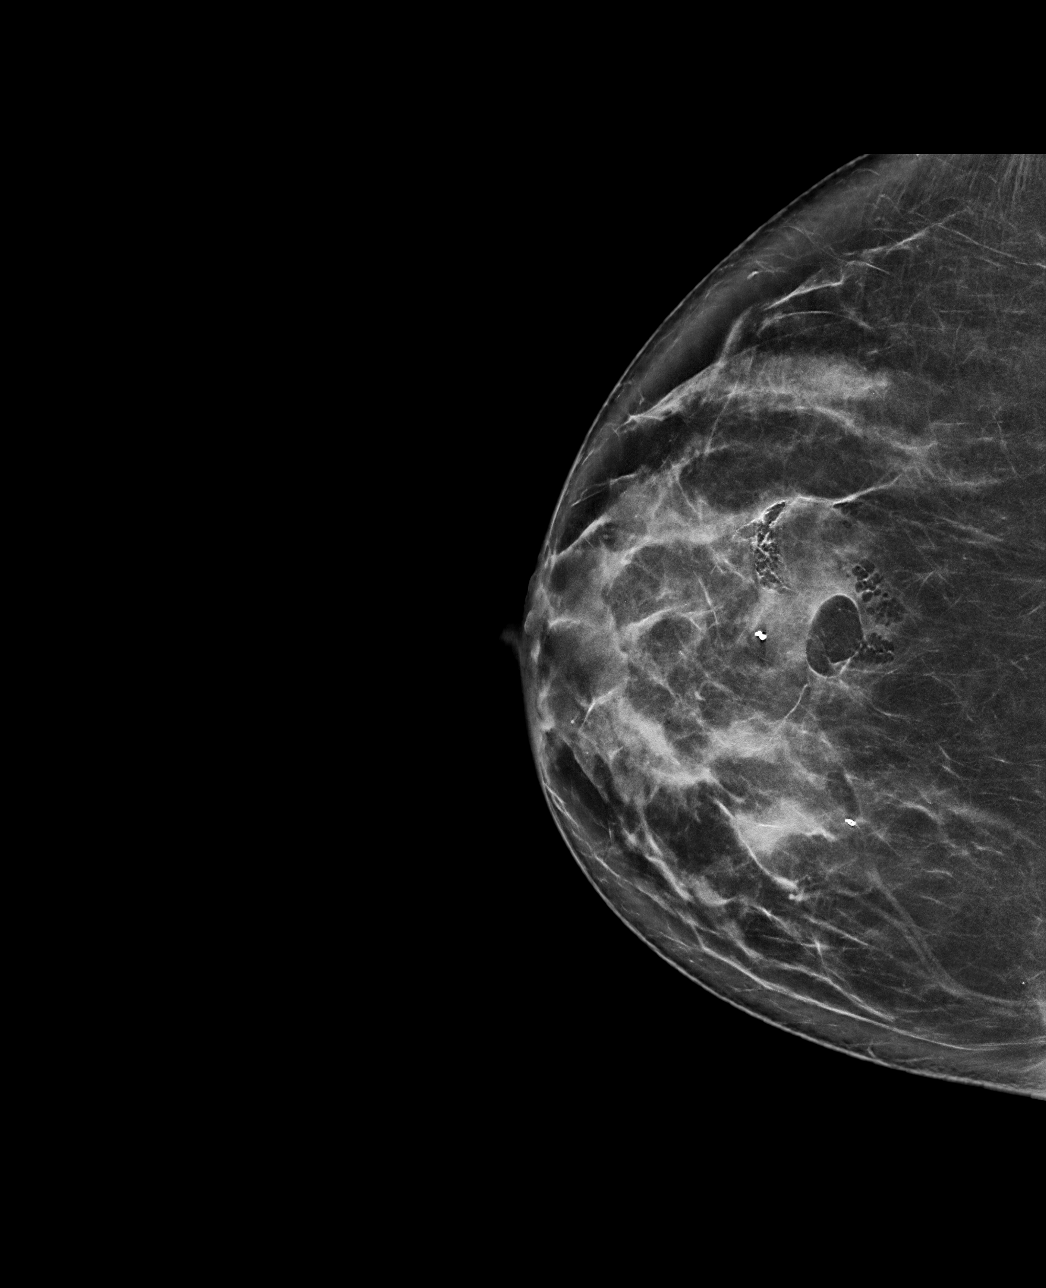

[R ML tomo · tomo slice 43/86.0]
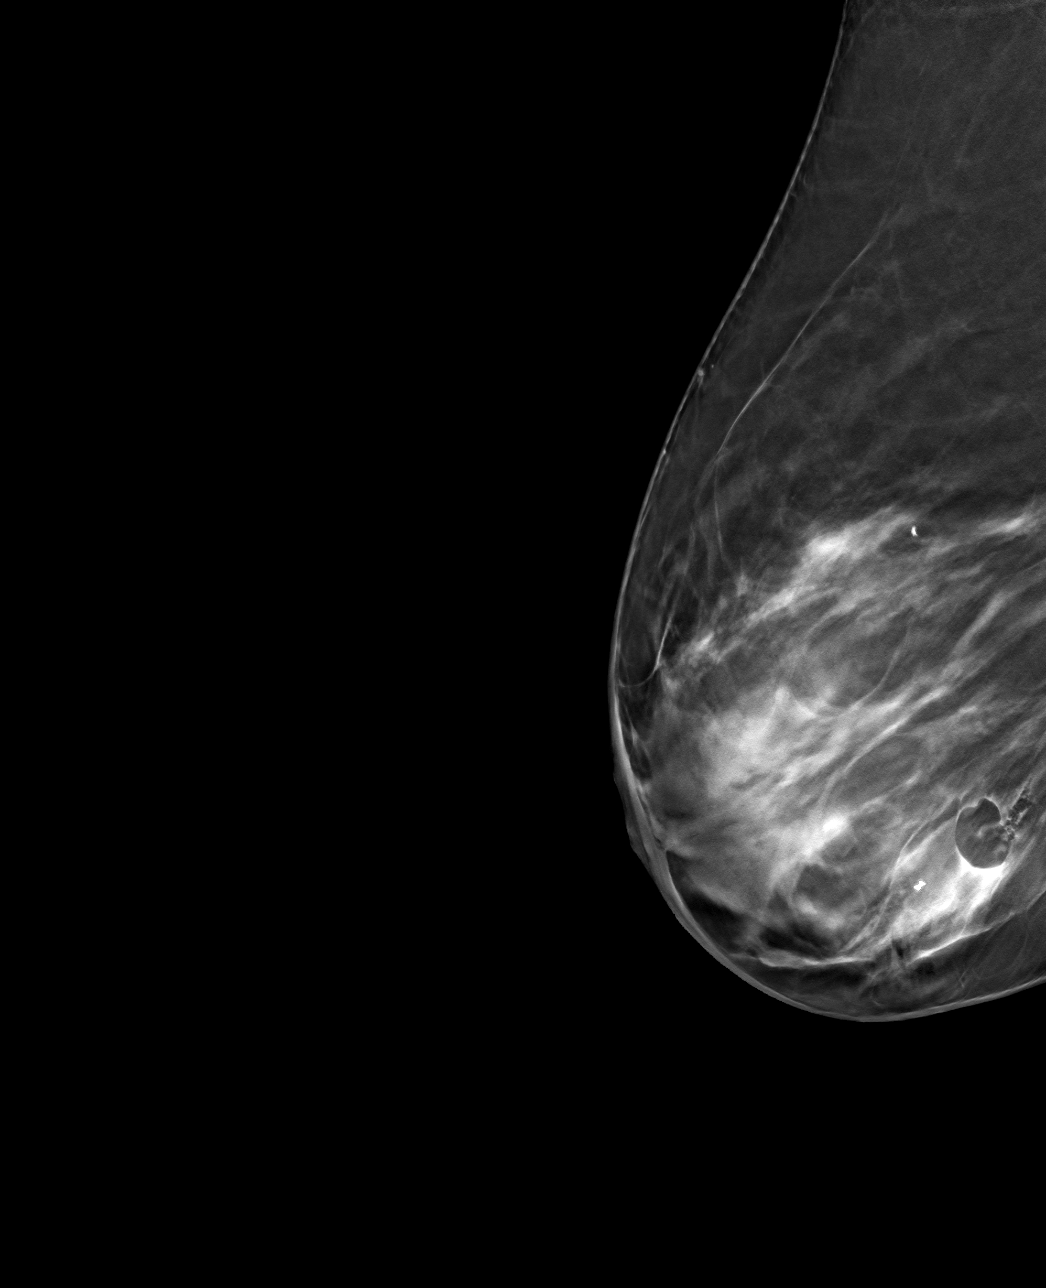

[R CC tomo · tomo slice 41/82.0]
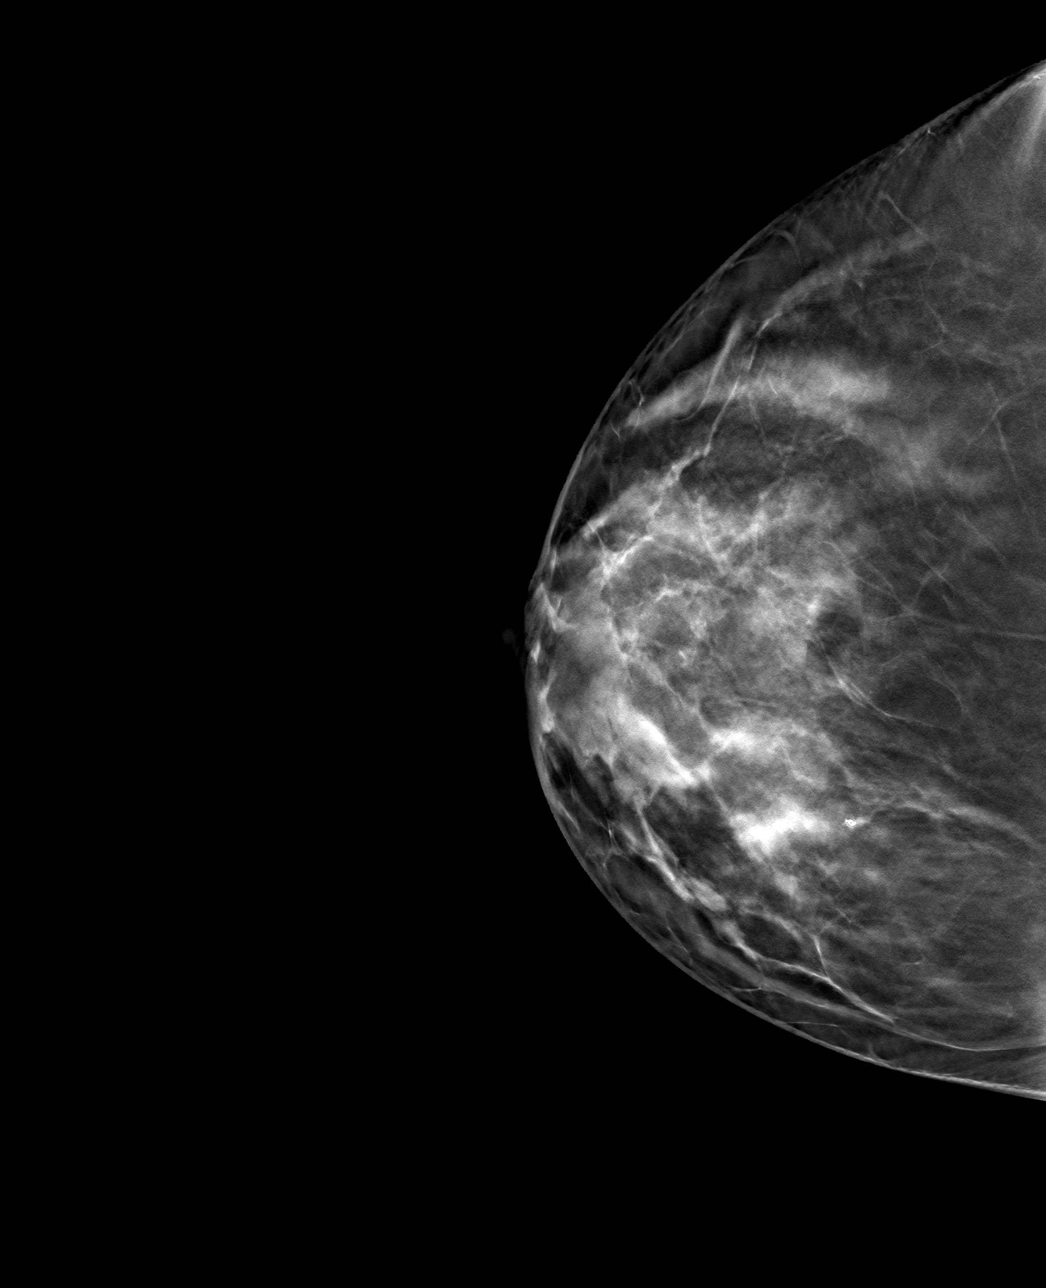

[4 of 12 positions shown; findings below may reference images not displayed]

FINDINGS: Tomosynthesis and synthesized digital 2D full field CC and
mediolateral images of both breasts were obtained following MRI
guided biopsy of 2 lesions in the LEFT breast and a single lesion in
the RIGHT breast.

LEFT: The dumbbell-shaped tissue marking clip is appropriately
position at the site of the biopsied clustered masses versus
non-mass enhancement in the UPPER INNER QUADRANT at POSTERIOR depth.

The cylinder shaped tissue marker clip is appropriately positioned
at the site of the biopsied mass at the near 6 o'clock position at
ANTERIOR to MIDDLE depth.

RIGHT: The dumbbell-shaped tissue marker clip migrated approximately
1 cm medially in the biopsy cavity relative to the biopsied mass in
the LOWER OUTER QUADRANT at MIDDLE depth.

Expected post biopsy changes are present at all 3 sites without
evidence of hematoma.
IMPRESSION: 1. Appropriate positioning of the dumbbell shaped tissue marking
clip at the site of the biopsied clustered masses versus NME in the
UPPER INNER QUADRANT of the LEFT breast at POSTERIOR depth.
2. Appropriate positioning of the cylinder shaped tissue marking
clip at the site of the biopsied mass in the LOWER LEFT breast at
the near 6 o'clock position.
3. Approximate 1 cm MEDIAL migration of the dumbbell-shaped tissue
marker clip relative to the biopsied mass in the LOWER OUTER
QUADRANT of the RIGHT breast at MIDDLE depth.

Final Assessment: Post Procedure Mammograms for Marker Placement

## 2020-02-21 MED ORDER — GADOBUTROL 1 MMOL/ML IV SOLN
10.0000 mL | Freq: Once | INTRAVENOUS | Status: AC | PRN
  Administered 2020-02-21: 10 mL via INTRAVENOUS
